# Patient Record
Sex: Male | Born: 1960 | Race: Black or African American | Hispanic: No | Smoking: Never smoker
Health system: Southern US, Community
[De-identification: ages and names within clinical notes are randomized; demographics above are authoritative.]

## PROBLEM LIST (undated history)

## (undated) HISTORY — PX: ANKLE FRACTURE SURGERY: SHX122

---

## 2005-11-20 ENCOUNTER — Encounter (INDEPENDENT_AMBULATORY_CARE_PROVIDER_SITE_OTHER): Payer: Self-pay | Admitting: Internal Medicine

## 2005-11-20 ENCOUNTER — Emergency Department (HOSPITAL_COMMUNITY): Admission: EM | Admit: 2005-11-20 | Discharge: 2005-11-20 | Payer: Self-pay | Admitting: Emergency Medicine

## 2005-11-20 LAB — CONVERTED CEMR LAB: Blood Glucose, Fasting: 106 mg/dL

## 2006-01-06 ENCOUNTER — Ambulatory Visit: Payer: Self-pay | Admitting: Internal Medicine

## 2006-01-08 ENCOUNTER — Encounter (INDEPENDENT_AMBULATORY_CARE_PROVIDER_SITE_OTHER): Payer: Self-pay | Admitting: Internal Medicine

## 2006-01-14 ENCOUNTER — Encounter: Payer: Self-pay | Admitting: Internal Medicine

## 2006-01-14 DIAGNOSIS — M129 Arthropathy, unspecified: Secondary | ICD-10-CM | POA: Insufficient documentation

## 2011-04-13 ENCOUNTER — Emergency Department (HOSPITAL_COMMUNITY)
Admission: EM | Admit: 2011-04-13 | Discharge: 2011-04-13 | Disposition: A | Discharge status: Home / Self Care | Payer: Self-pay | Attending: Emergency Medicine | Admitting: Emergency Medicine

## 2011-04-13 ENCOUNTER — Emergency Department (HOSPITAL_COMMUNITY): Payer: Self-pay

## 2011-04-13 ENCOUNTER — Other Ambulatory Visit: Payer: Self-pay

## 2011-04-13 ENCOUNTER — Encounter (HOSPITAL_COMMUNITY): Payer: Self-pay | Admitting: *Deleted

## 2011-04-13 DIAGNOSIS — M792 Neuralgia and neuritis, unspecified: Secondary | ICD-10-CM

## 2011-04-13 DIAGNOSIS — Z136 Encounter for screening for cardiovascular disorders: Secondary | ICD-10-CM | POA: Insufficient documentation

## 2011-04-13 DIAGNOSIS — IMO0002 Reserved for concepts with insufficient information to code with codable children: Secondary | ICD-10-CM | POA: Insufficient documentation

## 2011-04-13 DIAGNOSIS — R209 Unspecified disturbances of skin sensation: Secondary | ICD-10-CM | POA: Insufficient documentation

## 2011-04-13 MED ORDER — IBUPROFEN 800 MG PO TABS: 800.0000 mg | ORAL_TABLET | Freq: Three times a day (TID) | ORAL | Status: AC

## 2011-04-13 MED ORDER — IBUPROFEN 800 MG PO TABS
800.0000 mg | ORAL_TABLET | Freq: Once | ORAL | Status: AC
  Administered 2011-04-13: 800 mg via ORAL
  Filled 2011-04-13 (×2): qty 1

## 2011-04-13 MED ORDER — ASPIRIN 325 MG PO TABS
325.0000 mg | ORAL_TABLET | Freq: Once | ORAL | Status: AC
  Administered 2011-04-13: 325 mg via ORAL
  Filled 2011-04-13: qty 1

## 2011-04-13 NOTE — ED Notes (Signed)
Pt c/o numbness and tingling in his left arm since 8 am. Pt also c/o shortness of breath. Symptoms started suddenly. Pt has bilateral strong hand grips and leg movements. Pt has symmetry with smiling, squinting his eyes and sticking out his tongue. No difficulty speaking or slurred speech.

## 2011-04-13 NOTE — ED Provider Notes (Signed)
This chart was scribed for [employer]. Colon Branch, MD by Wallis Mart. The patient was seen in room APA08/APA08 and the patient's care was started at 10:00 AM.   CSN: [id number]  Arrival date & time 04/13/11  8469   First MD Initiated Contact with Patient 04/13/11 [phone]      Chief Complaint  Patient presents with  . Numbness    (Consider location/radiation/quality/duration/timing/severity/associated sxs/prior treatment) HPI  Louis [name] is a 51 y.o. male who presents to the Emergency Department complaining of sudden onset, persistence of constant numbness and tingling in left arm onset 8 am. The problem has not worsened or improved since onset. Pt Describes the numbness as a "cold feeling" Pt also c/o SOB that started when waking up.  The SOB did not get worse when walking.  Denies trouble speaking, swallowing, vision changes.  There are no other associated symptoms and no other alleviating or aggravating factors.    [facility] History reviewed. No pertinent past medical history.  Past Surgical History  Procedure Date  . Ankle fracture surgery     left    History reviewed. No pertinent family history.  History  Substance Use Topics  . Smoking status: Never Smoker   . Smokeless tobacco: Not on file  . Alcohol Use: Yes      Review of Systems  10 Systems reviewed and are negative for acute change except as noted in the HPI.  Allergies  Review of patient's allergies indicates no known allergies.  Home Medications  No current outpatient prescriptions on file.  BP 153/109  Pulse 99  Temp(Src) 98.4 F (36.9 C) (Oral)  Resp 18  Ht 5\' 11"  (1.803 m)  Wt 230 lb (104.327 kg)  BMI 32.08 kg/m2  SpO2 97%  Physical Exam  Nursing note and vitals reviewed. Constitutional: He is oriented to person, place, and time. He appears well-developed and well-nourished. No distress.  HENT:  Head: Normocephalic and atraumatic.  Eyes: EOM are normal. Pupils are  equal, round, and reactive to light.  Neck: Neck supple. No tracheal deviation present.       No carotid bruits  Cardiovascular: Normal rate, regular rhythm and normal heart sounds.   Pulmonary/Chest: Effort normal and breath sounds normal. No respiratory distress.  Abdominal: Soft. He exhibits no distension.  Musculoskeletal: Normal range of motion. He exhibits no edema.  Neurological: He is alert and oriented to person, place, and time. No sensory deficit.       Pt is predominately right handed, small amount of difference in strength right to left hand but sensation to light touch in both is normal, no facial asymmetry, no change in speech   Skin: Skin is warm and dry.  Psychiatric: He has a normal mood and affect. His behavior is normal.    ED Course  Procedures (including critical care time)  Date: 04/13/2011  0945  Rate:97  Rhythm: normal sinus rhythm  QRS Axis: normal  Intervals: normal  ST/T Wave abnormalities: normal  Conduction Disutrbances: none  Narrative Interpretation:   Old EKG Reviewed:No significant changes noted  DIAGNOSTIC STUDIES: Oxygen Saturation is 97% on room air, normal by my interpretation.    COORDINATION OF CARE:  1:14 PM: EDP at pt bedside.  All results reviewed and discussed with pt, questions answered, pt agreeable with plan.   Labs Reviewed - No data to display Dg Cervical Spine Complete  04/13/2011  *RADIOLOGY REPORT*  Clinical Data: Left hand and arm numbness.  CERVICAL SPINE - COMPLETE 4+  VIEW  Comparison: None.  Findings: No acute fracture or dislocation.  Degenerative changes with anterior osteophyte formation noted at C5-6. While in part exaggerated by positioning, there appeares to be degenerative changes at the same level that result in left neural foraminal narrowing on the oblique view.  Maintained craniocervical relationship. No prevertebral soft tissue swelling.  No aggressive osseous lesion.  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  No acute osseous abnormality identified.  Original Report Authenticated By: Waneta Martins, M.D.     No diagnosis found.    MDM  Patient with numbness and sensation of cold in his left arm and hand this morning upon awakening. Non focal neuro exam with good sensation and motion. Xray with C5-6 finding c/w associated L neural foraminal narrowing which could account for his symptoms. Initiated antiinflammatory therapy.Pt stable in ED with no significant deterioration in condition.The patient appears reasonably screened and/or stabilized for discharge and I doubt any other medical condition or other [facility] requiring further screening, evaluation, or treatment in the ED at this time prior to discharge.  I personally performed the services described in this documentation, which was scribed in my presence. The recorded information has been reviewed and considered.  MDM Reviewed: nursing note and vitals Interpretation: x-ray          Nicoletta Dress. Colon Branch, MD 04/13/11 1350

## 2011-04-13 NOTE — Discharge Instructions (Signed)
The numbness and tingling you felt is most likely due to a narrowing of the nerve opening in your neck. Use the ibuprofen as directed. It should decrease the swelling and improve the feeling. Follow up with your doctor.

## 2018-05-06 DIAGNOSIS — M546 Pain in thoracic spine: Secondary | ICD-10-CM | POA: Diagnosis not present

## 2018-05-06 DIAGNOSIS — M545 Low back pain: Secondary | ICD-10-CM | POA: Diagnosis not present

## 2018-05-06 DIAGNOSIS — M9903 Segmental and somatic dysfunction of lumbar region: Secondary | ICD-10-CM | POA: Diagnosis not present

## 2018-05-06 DIAGNOSIS — M9902 Segmental and somatic dysfunction of thoracic region: Secondary | ICD-10-CM | POA: Diagnosis not present

## 2019-02-23 DIAGNOSIS — M19072 Primary osteoarthritis, left ankle and foot: Secondary | ICD-10-CM | POA: Diagnosis not present

## 2019-02-23 DIAGNOSIS — M19172 Post-traumatic osteoarthritis, left ankle and foot: Secondary | ICD-10-CM | POA: Diagnosis not present

## 2019-02-23 DIAGNOSIS — Z0000 Encounter for general adult medical examination without abnormal findings: Secondary | ICD-10-CM | POA: Diagnosis not present

## 2019-02-23 DIAGNOSIS — Z13220 Encounter for screening for lipoid disorders: Secondary | ICD-10-CM | POA: Diagnosis not present

## 2019-10-26 ENCOUNTER — Ambulatory Visit (INDEPENDENT_AMBULATORY_CARE_PROVIDER_SITE_OTHER): Payer: BC Managed Care – PPO | Admitting: Orthopaedic Surgery

## 2019-10-26 ENCOUNTER — Ambulatory Visit: Payer: BC Managed Care – PPO

## 2019-10-26 ENCOUNTER — Encounter: Payer: Self-pay | Admitting: Orthopaedic Surgery

## 2019-10-26 ENCOUNTER — Other Ambulatory Visit: Payer: Self-pay

## 2019-10-26 VITALS — BP 151/97 | HR 80 | Ht 70.5 in | Wt 245.0 lb

## 2019-10-26 DIAGNOSIS — M25561 Pain in right knee: Secondary | ICD-10-CM

## 2019-10-26 DIAGNOSIS — G8929 Other chronic pain: Secondary | ICD-10-CM

## 2019-10-26 MED ORDER — NAPROXEN 500 MG PO TABS: 500.0000 mg | ORAL_TABLET | Freq: Two times a day (BID) | ORAL | 5 refills | Status: DC

## 2019-10-26 NOTE — Patient Instructions (Signed)
Journal for Nurse Practitioners, 15(4), 263-267. Retrieved December 01, 2017 from http://clinicalkey.com/nursing">  Knee Exercises Ask your health care provider which exercises are safe for you. Do exercises exactly as told by your health care provider and adjust them as directed. It is normal to feel mild stretching, pulling, tightness, or discomfort as you do these exercises. Stop right away if you feel sudden pain or your pain gets worse. Do not begin these exercises until told by your health care provider. Stretching and range-of-motion exercises These exercises warm up your muscles and joints and improve the movement and flexibility of your knee. These exercises also help to relieve pain and swelling. Knee extension, prone 1. Lie on your abdomen (prone position) on a bed. 2. Place your left / right knee just beyond the edge of the surface so your knee is not on the bed. You can put a towel under your left / right thigh just above your kneecap for comfort. 3. Relax your leg muscles and allow gravity to straighten your knee (extension). You should feel a stretch behind your left / right knee. 4. Hold this position for __________ seconds. 5. Scoot up so your knee is supported between repetitions. Repeat __________ times. Complete this exercise __________ times a day. Knee flexion, active  1. Lie on your back with both legs straight. If this causes back discomfort, bend your left / right knee so your foot is flat on the floor. 2. Slowly slide your left / right heel back toward your buttocks. Stop when you feel a gentle stretch in the front of your knee or thigh (flexion). 3. Hold this position for __________ seconds. 4. Slowly slide your left / right heel back to the starting position. Repeat __________ times. Complete this exercise __________ times a day. Quadriceps stretch, prone  1. Lie on your abdomen on a firm surface, such as a bed or padded floor. 2. Bend your left / right knee and hold  your ankle. If you cannot reach your ankle or pant leg, loop a belt around your foot and grab the belt instead. 3. Gently pull your heel toward your buttocks. Your knee should not slide out to the side. You should feel a stretch in the front of your thigh and knee (quadriceps). 4. Hold this position for __________ seconds. Repeat __________ times. Complete this exercise __________ times a day. Hamstring, supine 1. Lie on your back (supine position). 2. Loop a belt or towel over the ball of your left / right foot. The ball of your foot is on the walking surface, right under your toes. 3. Straighten your left / right knee and slowly pull on the belt to raise your leg until you feel a gentle stretch behind your knee (hamstring). ? Do not let your knee bend while you do this. ? Keep your other leg flat on the floor. 4. Hold this position for __________ seconds. Repeat __________ times. Complete this exercise __________ times a day. Strengthening exercises These exercises build strength and endurance in your knee. Endurance is the ability to use your muscles for a long time, even after they get tired. Quadriceps, isometric This exercise stretches the muscles in front of your thigh (quadriceps) without moving your knee joint (isometric). 1. Lie on your back with your left / right leg extended and your other knee bent. Put a rolled towel or small pillow under your knee if told by your health care provider. 2. Slowly tense the muscles in the front of your left /   right thigh. You should see your kneecap slide up toward your hip or see increased dimpling just above the knee. This motion will push the back of the knee toward the floor. 3. For __________ seconds, hold the muscle as tight as you can without increasing your pain. 4. Relax the muscles slowly and completely. Repeat __________ times. Complete this exercise __________ times a day. Straight leg raises This exercise stretches the muscles in front  of your thigh (quadriceps) and the muscles that move your hips (hip flexors). 1. Lie on your back with your left / right leg extended and your other knee bent. 2. Tense the muscles in the front of your left / right thigh. You should see your kneecap slide up or see increased dimpling just above the knee. Your thigh may even shake a bit. 3. Keep these muscles tight as you raise your leg 4-6 inches (10-15 cm) off the floor. Do not let your knee bend. 4. Hold this position for __________ seconds. 5. Keep these muscles tense as you lower your leg. 6. Relax your muscles slowly and completely after each repetition. Repeat __________ times. Complete this exercise __________ times a day. Hamstring, isometric 1. Lie on your back on a firm surface. 2. Bend your left / right knee about __________ degrees. 3. Dig your left / right heel into the surface as if you are trying to pull it toward your buttocks. Tighten the muscles in the back of your thighs (hamstring) to "dig" as hard as you can without increasing any pain. 4. Hold this position for __________ seconds. 5. Release the tension gradually and allow your muscles to relax completely for __________ seconds after each repetition. Repeat __________ times. Complete this exercise __________ times a day. Hamstring curls If told by your health care provider, do this exercise while wearing ankle weights. Begin with __________ lb weights. Then increase the weight by 1 lb (0.5 kg) increments. Do not wear ankle weights that are more than __________ lb. 1. Lie on your abdomen with your legs straight. 2. Bend your left / right knee as far as you can without feeling pain. Keep your hips flat against the floor. 3. Hold this position for __________ seconds. 4. Slowly lower your leg to the starting position. Repeat __________ times. Complete this exercise __________ times a day. Squats This exercise strengthens the muscles in front of your thigh and knee  (quadriceps). 1. Stand in front of a table, with your feet and knees pointing straight ahead. You may rest your hands on the table for balance but not for support. 2. Slowly bend your knees and lower your hips like you are going to sit in a chair. ? Keep your weight over your heels, not over your toes. ? Keep your lower legs upright so they are parallel with the table legs. ? Do not let your hips go lower than your knees. ? Do not bend lower than told by your health care provider. ? If your knee pain increases, do not bend as low. 3. Hold the squat position for __________ seconds. 4. Slowly push with your legs to return to standing. Do not use your hands to pull yourself to standing. Repeat __________ times. Complete this exercise __________ times a day. Wall slides This exercise strengthens the muscles in front of your thigh and knee (quadriceps). 1. Lean your back against a smooth wall or door, and walk your feet out 18-24 inches (46-61 cm) from it. 2. Place your feet hip-width apart. 3.   Slowly slide down the wall or door until your knees bend __________ degrees. Keep your knees over your heels, not over your toes. Keep your knees in line with your hips. 4. Hold this position for __________ seconds. Repeat __________ times. Complete this exercise __________ times a day. Straight leg raises This exercise strengthens the muscles that rotate the leg at the hip and move it away from your body (hip abductors). 1. Lie on your side with your left / right leg in the top position. Lie so your head, shoulder, knee, and hip line up. You may bend your bottom knee to help you keep your balance. 2. Roll your hips slightly forward so your hips are stacked directly over each other and your left / right knee is facing forward. 3. Leading with your heel, lift your top leg 4-6 inches (10-15 cm). You should feel the muscles in your outer hip lifting. ? Do not let your foot drift forward. ? Do not let your knee  roll toward the ceiling. 4. Hold this position for __________ seconds. 5. Slowly return your leg to the starting position. 6. Let your muscles relax completely after each repetition. Repeat __________ times. Complete this exercise __________ times a day. Straight leg raises This exercise stretches the muscles that move your hips away from the front of the pelvis (hip extensors). 1. Lie on your abdomen on a firm surface. You can put a pillow under your hips if that is more comfortable. 2. Tense the muscles in your buttocks and lift your left / right leg about 4-6 inches (10-15 cm). Keep your knee straight as you lift your leg. 3. Hold this position for __________ seconds. 4. Slowly lower your leg to the starting position. 5. Let your leg relax completely after each repetition. Repeat __________ times. Complete this exercise __________ times a day. This information is not intended to replace advice given to you by your health care provider. Make sure you discuss any questions you have with your health care provider. Document Revised: 12/02/2017 Document Reviewed: 12/02/2017 Elsevier Patient Education  2020 [employer]  

## 2019-10-26 NOTE — Progress Notes (Signed)
Subjective:    Patient ID: Louis [name], male    DOB: 04-13-1960, 59 y.o.   MRN: [id number]  HPI He has had increasing knee pain over the last two to three months.  He has swelling and popping of the right knee.  He has no trauma.  He has no giving way.  He has no redness.  He has tried ice, heat, rest and Advil with little help.     Review of Systems  Constitutional: Positive for activity change.  Musculoskeletal: Positive for arthralgias, gait problem and joint swelling.  All other systems reviewed and are negative.  For Review of Systems, all other systems reviewed and are negative.  The following is a summary of the past history medically, past history surgically, known current medicines, social history and family history.  This information is gathered electronically by the computer from prior information and documentation.  I review this each visit and have found including this information at this point in the chart is beneficial and informative.   History reviewed. No pertinent past medical history.  Past Surgical History:  Procedure Laterality Date  . ANKLE FRACTURE SURGERY     left    Current Outpatient Medications on File Prior to Visit  Medication Sig Dispense Refill  . diclofenac (VOLTAREN) 25 MG EC tablet Take 25-50 mg by mouth as needed.     Marland Kitchen ibuprofen (ADVIL,MOTRIN) 200 MG tablet Take 400 mg by mouth every 6 (six) hours as needed. For pain     No current facility-administered medications on file prior to visit.    Social History   Socioeconomic History  . Marital status: Divorced    Spouse name: Not on file  . Number of children: Not on file  . Years of education: Not on file  . Highest education level: Not on file  Occupational History  . Not on file  Tobacco Use  . Smoking status: Never Smoker  . Smokeless tobacco: Never Used  Substance and Sexual Activity  . Alcohol use: Yes  . Drug use: No  . Sexual activity: Not Currently  Other Topics  Concern  . Not on file  Social History Narrative  . Not on file   Social Determinants of Health   Financial Resource Strain:   . Difficulty of Paying Living Expenses: Not on file  Food Insecurity:   . Worried About Charity fundraiser in the Last Year: Not on file  . Ran Out of Food in the Last Year: Not on file  Transportation Needs:   . Lack of Transportation (Medical): Not on file  . Lack of Transportation (Non-Medical): Not on file  Physical Activity:   . Days of Exercise per Week: Not on file  . Minutes of Exercise per Session: Not on file  Stress:   . Feeling of Stress : Not on file  Social Connections:   . Frequency of Communication with Friends and Family: Not on file  . Frequency of Social Gatherings with Friends and Family: Not on file  . Attends Religious Services: Not on file  . Active Member of Clubs or Organizations: Not on file  . Attends Archivist Meetings: Not on file  . Marital Status: Not on file  Intimate Partner Violence:   . Fear of Current or Ex-Partner: Not on file  . Emotionally Abused: Not on file  . Physically Abused: Not on file  . Sexually Abused: Not on file    Family History  Problem Relation Age  of Onset  . Broken bones Neg Hx   . Diabetes Neg Hx   . Osteoporosis Neg Hx   . Dislocations Neg Hx     BP (!) 151/97   Pulse 80   Ht 5' 10.5" (1.791 m)   Wt 245 lb (111.1 kg)   BMI 34.66 kg/m   Body mass index is 34.66 kg/m.     Objective:   Physical Exam Vitals and nursing note reviewed.  Constitutional:      Appearance: He is well-developed.  HENT:     Head: Normocephalic and atraumatic.  Eyes:     Conjunctiva/sclera: Conjunctivae normal.     Pupils: Pupils are equal, round, and reactive to light.  Cardiovascular:     Rate and Rhythm: Normal rate and regular rhythm.  Pulmonary:     Effort: Pulmonary effort is normal.  Abdominal:     Palpations: Abdomen is soft.  Musculoskeletal:     Cervical back: Normal range  of motion and neck supple.       Legs:  Skin:    General: Skin is warm and dry.  Neurological:     Mental Status: He is alert and oriented to person, place, and time.     Cranial Nerves: No cranial nerve deficit.     Motor: No abnormal muscle tone.     Coordination: Coordination normal.     Deep Tendon Reflexes: Reflexes are normal and symmetric. Reflexes normal.  Psychiatric:        Behavior: Behavior normal.        Thought Content: Thought content normal.        Judgment: Judgment normal.    X-rays were done of the right knee, reported separately.      Assessment & Plan:   Encounter Diagnosis  Name Primary?  . Chronic pain of right knee Yes   PROCEDURE NOTE:  The patient requests injections of the right knee , verbal consent was obtained.  The right knee was prepped appropriately after time out was performed.   Sterile technique was observed and injection of 1 cc of Depo-Medrol 40 mg with several cc's of plain xylocaine. Anesthesia was provided by ethyl chloride and a 20-gauge needle was used to inject the knee area. The injection was tolerated well.  A band aid dressing was applied.  The patient was advised to apply ice later today and tomorrow to the injection sight as needed.  Begin Naprosyn 500 po bid pc.  Return in 3 weeks.  He may need MRI.  Call if any problem.  Precautions discussed.   Electronically Signed Sanjuana Kava, MD 8/31/202111:33 AM

## 2019-11-16 ENCOUNTER — Encounter: Payer: Self-pay | Admitting: Orthopaedic Surgery

## 2019-11-16 ENCOUNTER — Other Ambulatory Visit: Payer: Self-pay

## 2019-11-16 ENCOUNTER — Ambulatory Visit (INDEPENDENT_AMBULATORY_CARE_PROVIDER_SITE_OTHER): Payer: BC Managed Care – PPO | Admitting: Orthopaedic Surgery

## 2019-11-16 VITALS — BP 141/98 | HR 87 | Ht 70.5 in | Wt 246.0 lb

## 2019-11-16 DIAGNOSIS — G8929 Other chronic pain: Secondary | ICD-10-CM | POA: Diagnosis not present

## 2019-11-16 DIAGNOSIS — M25561 Pain in right knee: Secondary | ICD-10-CM

## 2019-11-16 NOTE — Progress Notes (Signed)
Patient Louis [name], male DOB:16-Nov-1960, 59 y.o. ZSW:[id number]  Chief Complaint  Patient presents with  . Knee Pain    right     HPI  Louis [name] is a 59 y.o. male who has right knee pain.  He is improved after the injection last time.  He still has some pain and swelling but not like before.   He is taking the Naprosyn.  He has no new trauma.  He has no giving way.   Body mass index is 34.8 kg/m.  ROS  Review of Systems  Constitutional: Positive for activity change.  Musculoskeletal: Positive for arthralgias, gait problem and joint swelling.  All other systems reviewed and are negative.   All other systems reviewed and are negative.  The following is a summary of the past history medically, past history surgically, known current medicines, social history and family history.  This information is gathered electronically by the computer from prior information and documentation.  I review this each visit and have found including this information at this point in the chart is beneficial and informative.    History reviewed. No pertinent past medical history.  Past Surgical History:  Procedure Laterality Date  . ANKLE FRACTURE SURGERY     left    Family History  Problem Relation Age of Onset  . Broken bones Neg Hx   . Diabetes Neg Hx   . Osteoporosis Neg Hx   . Dislocations Neg Hx     Social History Social History   Tobacco Use  . Smoking status: Never Smoker  . Smokeless tobacco: Never Used  Substance Use Topics  . Alcohol use: Yes  . Drug use: No    No Known Allergies  Current Outpatient Medications  Medication Sig Dispense Refill  . naproxen (NAPROSYN) 500 MG tablet Take 1 tablet (500 mg total) by mouth 2 (two) times daily with a meal. 60 tablet 5   No current facility-administered medications for this visit.     Physical Exam  Blood pressure (!) 141/98, pulse 87, height 5' 10.5" (1.791 m), weight 246 lb (111.6 kg).  Constitutional:  overall normal hygiene, normal nutrition, well developed, normal grooming, normal body habitus. Assistive device:none  Musculoskeletal: gait and station Limp right slightlyl, muscle tone and strength are normal, no tremors or atrophy is present.  .  Neurological: coordination overall normal.  Deep tendon reflex/nerve stretch intact.  Sensation normal.  Cranial nerves II-XII intact.   Skin:   Normal overall no scars, lesions, ulcers or rashes. No psoriasis.  Psychiatric: Alert and oriented x 3.  Recent memory intact, remote memory unclear.  Normal mood and affect. Well groomed.  Good eye contact.  Cardiovascular: overall no swelling, no varicosities, no edema bilaterally, normal temperatures of the legs and arms, no clubbing, cyanosis and good capillary refill.  Lymphatic: palpation is normal.  Right knee with ROM 0 to 115, stable, crepitus, no effusion, slight limp right, NV intact.  All other systems reviewed and are negative   The patient has been educated about the nature of the problem(s) and counseled on treatment options.  The patient appeared to understand what I have discussed and is in agreement with it.  Encounter Diagnosis  Name Primary?  . Chronic pain of right knee Yes    PLAN Call if any problems.  Precautions discussed.  Continue current medications.   Return to clinic 1 month   Electronically Signed Sanjuana Kava, MD 9/21/20219:19 AM

## 2019-11-16 NOTE — Patient Instructions (Signed)
Use Aspercreme, Biofreeze or Voltaren gel over the counter 2-3 times daily make sure you rub it in well each time you use it.    Call if it gets worse we will see you sooner

## 2019-11-23 ENCOUNTER — Ambulatory Visit: Payer: BC Managed Care – PPO | Admitting: Orthopaedic Surgery

## 2019-12-14 ENCOUNTER — Other Ambulatory Visit: Payer: Self-pay

## 2019-12-14 ENCOUNTER — Encounter: Payer: Self-pay | Admitting: Orthopaedic Surgery

## 2019-12-14 ENCOUNTER — Ambulatory Visit: Payer: BC Managed Care – PPO | Admitting: Orthopaedic Surgery

## 2019-12-14 VITALS — BP 131/85 | HR 85 | Ht 70.0 in | Wt 245.0 lb

## 2019-12-14 DIAGNOSIS — M25561 Pain in right knee: Secondary | ICD-10-CM

## 2019-12-14 DIAGNOSIS — G8929 Other chronic pain: Secondary | ICD-10-CM

## 2019-12-14 NOTE — Progress Notes (Signed)
Patient Louis [name], male DOB:Aug 30, 1960, 59 y.o. WUJ:[id number]  Chief Complaint  Patient presents with  . Knee Pain    Chronic right knee pain.    HPI  Louis [name] is a 59 y.o. male who has continued pain of the right knee.  He has swelling and popping.  He has some days with little pain and other days when it hurts.  He feels it may give way but it has not.    I will set up PT and then MRI of the right knee.  I am concerned about meniscus tear.   Body mass index is 35.15 kg/m.  ROS  Review of Systems  Constitutional: Positive for activity change.  Musculoskeletal: Positive for arthralgias, gait problem and joint swelling.  All other systems reviewed and are negative.   All other systems reviewed and are negative.  The following is a summary of the past history medically, past history surgically, known current medicines, social history and family history.  This information is gathered electronically by the computer from prior information and documentation.  I review this each visit and have found including this information at this point in the chart is beneficial and informative.    History reviewed. No pertinent past medical history.  Past Surgical History:  Procedure Laterality Date  . ANKLE FRACTURE SURGERY     left    Family History  Problem Relation Age of Onset  . Broken bones Neg Hx   . Diabetes Neg Hx   . Osteoporosis Neg Hx   . Dislocations Neg Hx     Social History Social History   Tobacco Use  . Smoking status: Never Smoker  . Smokeless tobacco: Never Used  Substance Use Topics  . Alcohol use: Yes  . Drug use: No    No Known Allergies  Current Outpatient Medications  Medication Sig Dispense Refill  . naproxen (NAPROSYN) 500 MG tablet Take 1 tablet (500 mg total) by mouth 2 (two) times daily with a meal. 60 tablet 5   No current facility-administered medications for this visit.     Physical Exam  Blood pressure 131/85, pulse  85, height 5\' 10"  (1.778 m), weight 245 lb (111.1 kg).  Constitutional: overall normal hygiene, normal nutrition, well developed, normal grooming, normal body habitus. Assistive device:none  Musculoskeletal: gait and station Limp right, muscle tone and strength are normal, no tremors or atrophy is present.  .  Neurological: coordination overall normal.  Deep tendon reflex/nerve stretch intact.  Sensation normal.  Cranial nerves II-XII intact.   Skin:   Normal overall no scars, lesions, ulcers or rashes. No psoriasis.  Psychiatric: Alert and oriented x 3.  Recent memory intact, remote memory unclear.  Normal mood and affect. Well groomed.  Good eye contact.  Cardiovascular: overall no swelling, no varicosities, no edema bilaterally, normal temperatures of the legs and arms, no clubbing, cyanosis and good capillary refill.  Lymphatic: palpation is normal.  Right knee is tender, has effusion, crepitus, positive medial McMurray, limp right, NV intact.  All other systems reviewed and are negative   The patient has been educated about the nature of the problem(s) and counseled on treatment options.  The patient appeared to understand what I have discussed and is in agreement with it.  Encounter Diagnosis  Name Primary?  . Chronic pain of right knee Yes    PLAN Call if any problems.  Precautions discussed.  Continue current medications.   Return to clinic 3 weeks   To go to  PT.  To get MRI of the right knee.  Electronically Signed Sanjuana Kava, MD 10/19/20218:52 AM

## 2019-12-29 ENCOUNTER — Other Ambulatory Visit: Payer: Self-pay

## 2019-12-29 ENCOUNTER — Encounter (HOSPITAL_COMMUNITY): Payer: Self-pay | Admitting: Physical Therapy

## 2019-12-29 ENCOUNTER — Ambulatory Visit (HOSPITAL_COMMUNITY): Payer: BC Managed Care – PPO | Attending: Orthopaedic Surgery | Admitting: Physical Therapy

## 2019-12-29 DIAGNOSIS — M6281 Muscle weakness (generalized): Secondary | ICD-10-CM | POA: Insufficient documentation

## 2019-12-29 DIAGNOSIS — M25561 Pain in right knee: Secondary | ICD-10-CM | POA: Diagnosis not present

## 2019-12-29 DIAGNOSIS — R29898 Other symptoms and signs involving the musculoskeletal system: Secondary | ICD-10-CM | POA: Diagnosis not present

## 2019-12-29 DIAGNOSIS — R2689 Other abnormalities of gait and mobility: Secondary | ICD-10-CM | POA: Diagnosis not present

## 2019-12-29 NOTE — Patient Instructions (Signed)
Access Code: [email or web address] URL: [email or web address] Date: 12/29/2019 Prepared by: Mitzi Hansen Taiquan Campanaro  Exercises Sit to Stand with Arms Crossed - 1 x daily - 7 x weekly - 2 sets - 10 reps Seated Long Arc Quad - 1 x daily - 7 x weekly - 10 reps - 10 second hold

## 2019-12-29 NOTE — Therapy (Signed)
[place], [place], [address] Phone: [phone]   Fax:  [phone]  Physical Therapy Evaluation  Patient Details  Name: Louis [name] MRN: [id number] Date of Birth: 1960-11-26 Referring Provider (PT): Sanjuana Kava MD   Encounter Date: 12/29/2019   PT End of Session - 12/29/19 1124    Visit Number 1    Number of Visits 8    Date for PT Re-Evaluation 01/26/20    Authorization Type BCBS (no auth, VL 60 PT/OT)    PT Start Time 1045    PT Stop Time 1121    PT Time Calculation (min) 36 min    Activity Tolerance Patient tolerated treatment well    Behavior During Therapy [facility] for tasks assessed/performed           History reviewed. No pertinent past medical history.  Past Surgical History:  Procedure Laterality Date   ANKLE FRACTURE SURGERY     left    There were no vitals filed for this visit.    Subjective Assessment - 12/29/19 1044    Subjective Patient is a 59 y.o. male who presents to physical therapy with c/o chronic R knee pain. His knee has been bother him for a few months with insidious onset. Symptoms are worse with stairs, transfers, and sitting for extended periods. His pain is not constant and is worst with going down stairs. He denies popping, clicking, catching. He has been taking Naproxen which helped some. He denies history of knee problems. His main goal is to avoid surgery.    Limitations Other (comment)   stairs   Patient Stated Goals avoid surgery    Currently in Pain? No/denies   worst 7/10 with going down stairs             [facility] PT Assessment - 12/29/19 0001      Assessment   Medical Diagnosis Chronic Pain of R Knee    Referring Provider (PT) Sanjuana Kava MD    Onset Date/Surgical Date 08/28/19    Next MD Visit Nov 19    Prior Therapy None      Precautions   Precautions None      Restrictions   Weight Bearing Restrictions No      Balance Screen   Has the patient  fallen in the past 6 months No    Has the patient had a decrease in activity level because of a fear of falling?  No    Is the patient reluctant to leave their home because of a fear of falling?  No      Prior Function   Level of Independence Independent    Vocation Full time employment    Vocation Requirements [employer]      Cognition   Overall Cognitive Status Within Functional Limits for tasks assessed      Observation/Other Assessments   Observations Ambulates without AD    Focus on Therapeutic Outcomes (FOTO)  13% limited      ROM / Strength   AROM / PROM / Strength AROM;Strength      AROM   AROM Assessment Site Knee    Right/Left Knee Right    Right Knee Extension 0    Right Knee Flexion 128      Strength   Strength Assessment Site Hip;Knee;Ankle    Right/Left Hip Right;Left    Right Hip Flexion 4+/5    Left Hip Flexion 4+/5    Right/Left Knee Right;Left  Right Knee Flexion 5/5    Right Knee Extension 5/5    Left Knee Flexion 5/5    Left Knee Extension 5/5    Right/Left Ankle Right;Left    Right Ankle Dorsiflexion 5/5    Left Ankle Dorsiflexion 5/5      Palpation   Patella mobility WNL    Palpation comment TTP R tibial tuberosity, not tender throughout bilateral LE       Special Tests   Other special tests Forward step down test: R impaired quad and hip strenght/motor control requiring UE support;  L difficulty due to L ankle stiffness      Transfers   Five time sit to stand comments  11.29 seconds    Comments without UE use, knee pain during       Ambulation/Gait   Ambulation/Gait Yes    Ambulation/Gait Assistance 7: Independent    Ambulation Distance (Feet) 100 Feet    Assistive device None    Gait Pattern Within Functional Limits;Step-through pattern    Stairs Yes    Stairs Assistance 7: Independent    Stair Management Technique No rails    Number of Stairs 8    Height of Stairs 7    Gait Comments decreased eccentric control with stairs                        Objective measurements completed on examination: See above findings.       [facility] Adult PT Treatment/Exercise - 12/29/19 0001      Exercises   Exercises Knee/Hip      Knee/Hip Exercises: Seated   Long Arc Quad 10 reps    Long Arc Quad Limitations 10 second holds    Sit to [employer] 2 sets;10 reps                  PT Education - 12/29/19 1043    Education Details Patient educated on exam findings, POC, scope of PT, initial HEP    Person(s) Educated Patient    Methods Explanation;Demonstration;Handout    Comprehension Verbalized understanding;Returned demonstration            PT Short Term Goals - 12/29/19 1131      PT SHORT TERM GOAL #1   Title Patient will be independent with HEP in order to improve functional outcomes.    Time 2    Period Weeks    Status New    Target Date 01/12/20      PT SHORT TERM GOAL #2   Title Patient will report at least 25% improvement in symptoms for improved quality of life.    Time 2    Period Weeks    Status New    Target Date 01/12/20             PT Long Term Goals - 12/29/19 1226      PT LONG TERM GOAL #1   Title Patient will report at least 75% improvement in symptoms for improved quality of life.    Time 4    Period Weeks    Status New    Target Date 01/26/20      PT LONG TERM GOAL #2   Title Patient will improve FOTO score by at least 5 points in order to indicate improved tolerance to activity.    Time 4    Period Weeks    Status New    Target Date 01/26/20      PT LONG TERM  GOAL #3   Title Patient will be able to navigate stairs with reciprocal pattern without compensation on RLE in order to demonstrate improved LE strength and motor control.    Time 4    Period Weeks    Status New    Target Date 01/26/20                  Plan - 12/29/19 1125    Clinical Impression Statement Patient is a 59 y.o. male who presents to physical therapy with c/o chronic R knee pain.  He presents with pain limited deficits in R knee strength, motor control, and functional mobility with ADL. He is having to modify and restrict ADL as indicated by FOTO score as well as subjective information and objective measures which is affecting overall participation. Patient will benefit from skilled physical therapy in order to improve function and reduce impairment.    Personal Factors and Comorbidities Fitness;Profession    Examination-Activity Limitations Transfers;Stairs;Squat;Locomotion Level    Examination-Participation Restrictions Occupation;Yard Work;Community Activity    Stability/Clinical Decision Making Stable/Uncomplicated    Clinical Decision Making Low    Rehab Potential Good    PT Frequency 2x / week    PT Duration 4 weeks    PT Treatment/Interventions ADLs/Self Care Home Management;Cryotherapy;Electrical Stimulation;DME Instruction;Ultrasound;Iontophoresis 4mg /ml Dexamethasone;Moist Heat;Traction;Gait training;Stair training;Functional mobility training;Therapeutic activities;Therapeutic exercise;Balance training;Neuromuscular re-education;Patient/family education;Orthotic Fit/Training;Manual techniques;Passive range of motion;Dry needling;Energy conservation;Splinting;Taping;Joint Manipulations    PT Next Visit Plan continue LE strengtheing with emphasis on quads, possibly further assess hip strength, begin functional strengthing with squats, steps, lunges, etc.    PT Home Exercise Plan 11/3 sit to stand, LAQ    Consulted and Agree with Plan of Care Patient           Patient will benefit from skilled therapeutic intervention in order to improve the following deficits and impairments:  Difficulty walking, Decreased endurance, Pain, Improper body mechanics, Decreased mobility, Decreased strength  Visit Diagnosis: Right knee pain, unspecified chronicity  Muscle weakness (generalized)  Other abnormalities of gait and mobility  Other symptoms and signs involving the  musculoskeletal system     Problem List Patient Active Problem List   Diagnosis Date Noted   ARTHROPATHY NOS, UNSPECIFIED SITE 01/14/2006   12:28 PM, 12/29/19 Mearl Latin PT, DPT Physical Therapist at [place], [place], [address] Phone: [phone]   Fax:  [phone]  Name: Louis [name] MRN: [id number] Date of Birth: 1960/10/02

## 2019-12-31 ENCOUNTER — Telehealth: Payer: Self-pay | Admitting: Orthopaedic Surgery

## 2019-12-31 NOTE — Telephone Encounter (Signed)
-----   Message from Cassandria Santee sent at 12/31/2019  9:25 AM EDT ----- Regarding: mri Do you know anything about this patient having an MRI?

## 2019-12-31 NOTE — Telephone Encounter (Signed)
Patient is going to PT at this time and he is coming back for a follow up 01/04/2020. He is concerned about the MRI. I let him know that we would get the order.

## 2020-01-04 ENCOUNTER — Other Ambulatory Visit: Payer: Self-pay

## 2020-01-04 ENCOUNTER — Encounter (HOSPITAL_COMMUNITY): Payer: Self-pay | Admitting: Physical Therapy

## 2020-01-04 ENCOUNTER — Telehealth: Payer: Self-pay | Admitting: Radiology

## 2020-01-04 ENCOUNTER — Encounter: Payer: Self-pay | Admitting: Orthopaedic Surgery

## 2020-01-04 ENCOUNTER — Ambulatory Visit (HOSPITAL_COMMUNITY): Payer: BC Managed Care – PPO | Admitting: Physical Therapy

## 2020-01-04 ENCOUNTER — Ambulatory Visit: Payer: BC Managed Care – PPO | Admitting: Orthopaedic Surgery

## 2020-01-04 VITALS — BP 131/89 | HR 87 | Ht 70.0 in | Wt 245.0 lb

## 2020-01-04 DIAGNOSIS — G8929 Other chronic pain: Secondary | ICD-10-CM

## 2020-01-04 DIAGNOSIS — M25561 Pain in right knee: Secondary | ICD-10-CM

## 2020-01-04 DIAGNOSIS — R29898 Other symptoms and signs involving the musculoskeletal system: Secondary | ICD-10-CM

## 2020-01-04 DIAGNOSIS — M6281 Muscle weakness (generalized): Secondary | ICD-10-CM

## 2020-01-04 DIAGNOSIS — R2689 Other abnormalities of gait and mobility: Secondary | ICD-10-CM

## 2020-01-04 NOTE — Patient Instructions (Signed)
Access Code: [email or web address] URL: [email or web address] Date: 01/04/2020 Prepared by: Josue Hector  Exercises Supine Quad Set - 2 x daily - 7 x weekly - 2 sets - 10 reps - 5 second hold Active Straight Leg Raise with Quad Set - 2 x daily - 7 x weekly - 2 sets - 10 reps Supine Bridge - 2 x daily - 7 x weekly - 2 sets - 10 reps - 5 second hold Sidelying Hip Abduction - 2 x daily - 7 x weekly - 2 sets - 10 reps Standing Heel Raise with Support - 2 x daily - 7 x weekly - 2 sets - 10 reps

## 2020-01-04 NOTE — Therapy (Signed)
[place], [place], [address] Phone: [phone]   Fax:  [phone]  Physical Therapy Treatment  Patient Details  Name: Louis [name] MRN: [id number] Date of Birth: 03-03-1960 Referring Provider (PT): Sanjuana Kava MD   Encounter Date: 01/04/2020   PT End of Session - 01/04/20 1055    Visit Number 2    Number of Visits 8    Date for PT Re-Evaluation 01/26/20    Authorization Type BCBS (no auth, VL 60 PT/OT)    PT Start Time 1050    PT Stop Time 1115    PT Time Calculation (min) 25 min    Activity Tolerance Patient tolerated treatment well    Behavior During Therapy [facility] for tasks assessed/performed           History reviewed. No pertinent past medical history.  Past Surgical History:  Procedure Laterality Date  . ANKLE FRACTURE SURGERY     left    There were no vitals filed for this visit.   Subjective Assessment - 01/04/20 1055    Subjective Patient had followed up with MD this morning, says he wants to order an MRI. Says he goes back in a month. Says HEP exercises are going alright. No new complaints. No pain currently.    Limitations Other (comment)   stairs   Patient Stated Goals avoid surgery    Currently in Pain? No/denies                             [facility] Adult PT Treatment/Exercise - 01/04/20 0001      Knee/Hip Exercises: Standing   Heel Raises Both;1 set;10 reps      Knee/Hip Exercises: Supine   Quad Sets Right;10 reps    Quad Sets Limitations 5 seconds    Bridges Both;1 set;10 reps    Straight Leg Raises Right;1 set;10 reps      Knee/Hip Exercises: Sidelying   Hip ABduction Right;1 set;10 reps                  PT Education - 01/04/20 1113    Education Details on goals review, HEP, exercise and technique, updated HEP    Person(s) Educated Patient    Methods Explanation;Handout    Comprehension Verbalized understanding            PT Short Term  Goals - 12/29/19 1131      PT SHORT TERM GOAL #1   Title Patient will be independent with HEP in order to improve functional outcomes.    Time 2    Period Weeks    Status New    Target Date 01/12/20      PT SHORT TERM GOAL #2   Title Patient will report at least 25% improvement in symptoms for improved quality of life.    Time 2    Period Weeks    Status New    Target Date 01/12/20             PT Long Term Goals - 12/29/19 1226      PT LONG TERM GOAL #1   Title Patient will report at least 75% improvement in symptoms for improved quality of life.    Time 4    Period Weeks    Status New    Target Date 01/26/20      PT LONG TERM GOAL #2   Title Patient will improve FOTO score  by at least 5 points in order to indicate improved tolerance to activity.    Time 4    Period Weeks    Status New    Target Date 01/26/20      PT LONG TERM GOAL #3   Title Patient will be able to navigate stairs with reciprocal pattern without compensation on RLE in order to demonstrate improved LE strength and motor control.    Time 4    Period Weeks    Status New    Target Date 01/26/20                 Plan - 01/04/20 1116    Clinical Impression Statement Session abbreviated as indicated per patient late arrival. Reviewed therapy goals and HEP from eval. Progressed ther ex for hip and knee strengthening. Patient educated on proper form and function of all added exercise. Patient cued on LE position and hold times with forward and side lying leg raises. Patient educated on and issued updated HEP handout. Patient will continue to benefit from skilled therapy to progress knee strength and mobility to reduce pain and improve functional mobility.    Personal Factors and Comorbidities Fitness;Profession    Examination-Activity Limitations Transfers;Stairs;Squat;Locomotion Level    Examination-Participation Restrictions Occupation;Yard Work;Community Activity    Stability/Clinical Decision  Making Stable/Uncomplicated    Rehab Potential Good    PT Frequency 2x / week    PT Duration 4 weeks    PT Treatment/Interventions ADLs/Self Care Home Management;Cryotherapy;Electrical Stimulation;DME Instruction;Ultrasound;Iontophoresis 4mg /ml Dexamethasone;Moist Heat;Traction;Gait training;Stair training;Functional mobility training;Therapeutic activities;Therapeutic exercise;Balance training;Neuromuscular re-education;Patient/family education;Orthotic Fit/Training;Manual techniques;Passive range of motion;Dry needling;Energy conservation;Splinting;Taping;Joint Manipulations    PT Next Visit Plan continue LE strengtheing with emphasis on quads, functional strengthing with squats, steps, lunges, etc.    PT Home Exercise Plan 11/3 sit to stand, LAQ 01/04/20: bridge, quad set, slr, sideying hip abduction, heel raises    Consulted and Agree with Plan of Care Patient           Patient will benefit from skilled therapeutic intervention in order to improve the following deficits and impairments:  Difficulty walking, Decreased endurance, Pain, Improper body mechanics, Decreased mobility, Decreased strength  Visit Diagnosis: Right knee pain, unspecified chronicity  Muscle weakness (generalized)  Other abnormalities of gait and mobility  Other symptoms and signs involving the musculoskeletal system     Problem List Patient Active Problem List   Diagnosis Date Noted  . ARTHROPATHY NOS, UNSPECIFIED SITE 01/14/2006    11:18 AM, 01/04/20 Josue Hector PT DPT  Physical Therapist with [facility]  [phone] [place] [address] [place], [place], [address] Phone: [phone]   Fax:  [phone]  Name: Louis [name] MRN: [id number] Date of Birth: 16-Dec-1960

## 2020-01-04 NOTE — Progress Notes (Signed)
Patient Louis [name], male DOB:23-Dec-1960, 59 y.o. VWP:[id number]  Chief Complaint  Patient presents with  . Knee Pain    Chronic pain of right knee    HPI  Louis [name] is a 59 y.o. male who has continued pain of the right knee.  He has pain, swelling and giving way.  He is not better. He has been to PT.  I will get MRI. I have reviewed PT notes.  Body mass index is 35.15 kg/m.  ROS  Review of Systems  Constitutional: Positive for activity change.  Musculoskeletal: Positive for arthralgias, gait problem and joint swelling.  All other systems reviewed and are negative.   All other systems reviewed and are negative.  The following is a summary of the past history medically, past history surgically, known current medicines, social history and family history.  This information is gathered electronically by the computer from prior information and documentation.  I review this each visit and have found including this information at this point in the chart is beneficial and informative.    No past medical history on file.  Past Surgical History:  Procedure Laterality Date  . ANKLE FRACTURE SURGERY     left    Family History  Problem Relation Age of Onset  . Broken bones Neg Hx   . Diabetes Neg Hx   . Osteoporosis Neg Hx   . Dislocations Neg Hx     Social History Social History   Tobacco Use  . Smoking status: Never Smoker  . Smokeless tobacco: Never Used  Substance Use Topics  . Alcohol use: Yes  . Drug use: No    No Known Allergies  Current Outpatient Medications  Medication Sig Dispense Refill  . naproxen (NAPROSYN) 500 MG tablet Take 1 tablet (500 mg total) by mouth 2 (two) times daily with a meal. 60 tablet 5   No current facility-administered medications for this visit.     Physical Exam  Blood pressure 131/89, pulse 87, height 5\' 10"  (1.778 m), weight 245 lb (111.1 kg).  Constitutional: overall normal hygiene, normal nutrition, well  developed, normal grooming, normal body habitus. Assistive device:none  Musculoskeletal: gait and station Limp right, muscle tone and strength are normal, no tremors or atrophy is present.  .  Neurological: coordination overall normal.  Deep tendon reflex/nerve stretch intact.  Sensation normal.  Cranial nerves II-XII intact.   Skin:   Normal overall no scars, lesions, ulcers or rashes. No psoriasis.  Psychiatric: Alert and oriented x 3.  Recent memory intact, remote memory unclear.  Normal mood and affect. Well groomed.  Good eye contact.  Cardiovascular: overall no swelling, no varicosities, no edema bilaterally, normal temperatures of the legs and arms, no clubbing, cyanosis and good capillary refill.  Lymphatic: palpation is normal.  Right knee is tender, effusion, crepitus, positive medial McMurray, ROM 0 to 110, NV intact.  Limp right.  All other systems reviewed and are negative   The patient has been educated about the nature of the problem(s) and counseled on treatment options.  The patient appeared to understand what I have discussed and is in agreement with it.  Encounter Diagnosis  Name Primary?  . Chronic pain of right knee Yes    PLAN Call if any problems.  Precautions discussed.  Continue current medications.   Return to clinic 1 month   Get MRI of the right knee.  Electronically Signed Sanjuana Kava, MD 11/9/20219:06 AM

## 2020-01-04 NOTE — Telephone Encounter (Signed)
Zani Kyllonen check on MRI with Dr Aline Brochure or Dr Amedeo Kinsman get in sooner if you need. Per Dr Luna Glasgow

## 2020-01-06 ENCOUNTER — Encounter (HOSPITAL_COMMUNITY): Payer: Self-pay

## 2020-01-06 ENCOUNTER — Other Ambulatory Visit: Payer: Self-pay

## 2020-01-06 ENCOUNTER — Ambulatory Visit (HOSPITAL_COMMUNITY): Payer: BC Managed Care – PPO

## 2020-01-06 DIAGNOSIS — R29898 Other symptoms and signs involving the musculoskeletal system: Secondary | ICD-10-CM

## 2020-01-06 DIAGNOSIS — M25561 Pain in right knee: Secondary | ICD-10-CM | POA: Diagnosis not present

## 2020-01-06 DIAGNOSIS — M6281 Muscle weakness (generalized): Secondary | ICD-10-CM

## 2020-01-06 DIAGNOSIS — R2689 Other abnormalities of gait and mobility: Secondary | ICD-10-CM

## 2020-01-06 NOTE — Therapy (Signed)
[place], [place], [address] Phone: [phone]   Fax:  [phone]  Physical Therapy Treatment  Patient Details  Name: Louis [name] MRN: [id number] Date of Birth: 1961-02-09 Referring Provider (PT): Sanjuana Kava MD   Encounter Date: 01/06/2020   PT End of Session - 01/06/20 0901    Visit Number 3    Number of Visits 8    Date for PT Re-Evaluation 01/26/20    Authorization Type BCBS (no auth, VL 60 PT/OT)    PT Start Time 0830    PT Stop Time 0908    PT Time Calculation (min) 38 min    Activity Tolerance Patient tolerated treatment well    Behavior During Therapy [facility] for tasks assessed/performed           History reviewed. No pertinent past medical history.  Past Surgical History:  Procedure Laterality Date  . ANKLE FRACTURE SURGERY     left    There were no vitals filed for this visit.   Subjective Assessment - 01/06/20 0828    Subjective Pt stated he has began the home exercises, no reports of pain currently.    Patient Stated Goals avoid surgery    Currently in Pain? No/denies                             [facility] Adult PT Treatment/Exercise - 01/06/20 0001      Exercises   Exercises Knee/Hip      Knee/Hip Exercises: Stretches   Active Hamstring Stretch 2 reps;30 seconds    Active Hamstring Stretch Limitations standing 12in step height      Knee/Hip Exercises: Standing   Heel Raises 15 reps    Knee Flexion 10 reps    Hip Abduction 10 reps    Lateral Step Up Both;10 reps;Hand Hold: 1;Step Height: 4"    Forward Step Up Right;10 reps;Hand Hold: 1;Hand Hold: 0;Step Height: 4"    Functional Squat 10 reps    Functional Squat Limitations decreased Lt ankle DF; cueing for pressure on heels    Wall Squat 10 reps;3 seconds    SLS Rt 25" max of 3    Other Standing Knee Exercises tandem stance 1x 30" solid; foam 2x 30"      Knee/Hip Exercises: Seated   Sit to Sand 10  reps;without UE support   eccentric control                   PT Short Term Goals - 12/29/19 1131      PT SHORT TERM GOAL #1   Title Patient will be independent with HEP in order to improve functional outcomes.    Time 2    Period Weeks    Status New    Target Date 01/12/20      PT SHORT TERM GOAL #2   Title Patient will report at least 25% improvement in symptoms for improved quality of life.    Time 2    Period Weeks    Status New    Target Date 01/12/20             PT Long Term Goals - 12/29/19 1226      PT LONG TERM GOAL #1   Title Patient will report at least 75% improvement in symptoms for improved quality of life.    Time 4    Period Weeks    Status New  Target Date 01/26/20      PT LONG TERM GOAL #2   Title Patient will improve FOTO score by at least 5 points in order to indicate improved tolerance to activity.    Time 4    Period Weeks    Status New    Target Date 01/26/20      PT LONG TERM GOAL #3   Title Patient will be [name] to navigate stairs with reciprocal pattern without compensation on RLE in order to demonstrate improved LE strength and motor control.    Time 4    Period Weeks    Status New    Target Date 01/26/20                 Plan - 01/06/20 0902    Clinical Impression Statement Progressed functional strengthening to standing exercises.  Pt tolerated well to exercises though does reports some pain anterior knee wiht knee extension based exercises, pain resolved following exercise.  Pt with impaired Lt ankle mobility that did require cueing for form and mechanics to reduce strain on anterior knees.  No reoprts of increased pain at EOS.    Personal Factors and Comorbidities Fitness;Profession    Examination-Activity Limitations Transfers;Stairs;Squat;Locomotion Level    Examination-Participation Restrictions Occupation;Yard Work;Community Activity    Stability/Clinical Decision Making Stable/Uncomplicated    Clinical  Decision Making Low    Rehab Potential Good    PT Frequency 2x / week    PT Duration 4 weeks    PT Treatment/Interventions ADLs/Self Care Home Management;Cryotherapy;Electrical Stimulation;DME Instruction;Ultrasound;Iontophoresis 4mg /ml Dexamethasone;Moist Heat;Traction;Gait training;Stair training;Functional mobility training;Therapeutic activities;Therapeutic exercise;Balance training;Neuromuscular re-education;Patient/family education;Orthotic Fit/Training;Manual techniques;Passive range of motion;Dry needling;Energy conservation;Splinting;Taping;Joint Manipulations    PT Next Visit Plan continue LE strengtheing with emphasis on quads, functional strengthing with squats, steps, lunges, etc.    PT Home Exercise Plan 11/3 sit to stand, LAQ 01/04/20: bridge, quad set, slr, sideying hip abduction, heel raises           Patient will benefit from skilled therapeutic intervention in order to improve the following deficits and impairments:  Difficulty walking, Decreased endurance, Pain, Improper body mechanics, Decreased mobility, Decreased strength  Visit Diagnosis: Right knee pain, unspecified chronicity  Muscle weakness (generalized)  Other abnormalities of gait and mobility  Other symptoms and signs involving the musculoskeletal system     Problem List Patient Active Problem List   Diagnosis Date Noted  . ARTHROPATHY NOS, UNSPECIFIED SITE 01/14/2006   Ihor Austin, LPTA/CLT; CBIS [phone]  Aldona Lento 01/06/2020, 9:24 AM  [place] [address] [place], [place], [address] Phone: [phone]   Fax:  [phone]  Name: Louis [name] MRN: [id number] Date of Birth: 06-16-1960

## 2020-01-11 ENCOUNTER — Encounter (HOSPITAL_COMMUNITY): Payer: Self-pay | Admitting: Physical Therapy

## 2020-01-11 ENCOUNTER — Other Ambulatory Visit: Payer: Self-pay

## 2020-01-11 ENCOUNTER — Ambulatory Visit (HOSPITAL_COMMUNITY): Payer: BC Managed Care – PPO | Admitting: Physical Therapy

## 2020-01-11 DIAGNOSIS — R29898 Other symptoms and signs involving the musculoskeletal system: Secondary | ICD-10-CM

## 2020-01-11 DIAGNOSIS — M6281 Muscle weakness (generalized): Secondary | ICD-10-CM | POA: Diagnosis not present

## 2020-01-11 DIAGNOSIS — R2689 Other abnormalities of gait and mobility: Secondary | ICD-10-CM

## 2020-01-11 DIAGNOSIS — M25561 Pain in right knee: Secondary | ICD-10-CM

## 2020-01-11 NOTE — Patient Instructions (Signed)
Access Code: [email or web address] URL: [email or web address] Date: 01/11/2020 Prepared by: Mitzi Hansen Ninetta Adelstein  Exercises Lateral Step Down - 1 x daily - 7 x weekly - 2 sets - 10 reps

## 2020-01-11 NOTE — Therapy (Signed)
[place], [place], [address] Phone: [phone]   Fax:  [phone]  Physical Therapy Treatment  Patient Details  Name: Louis [name] MRN: [id number] Date of Birth: 1960-04-13 Referring Provider (PT): Sanjuana Kava MD   Encounter Date: 01/11/2020   PT End of Session - 01/11/20 0832    Visit Number 4    Number of Visits 8    Date for PT Re-Evaluation 01/26/20    Authorization Type BCBS (no auth, VL 60 PT/OT)    PT Start Time 0833    PT Stop Time 0912    PT Time Calculation (min) 39 min    Activity Tolerance Patient tolerated treatment well    Behavior During Therapy [facility] for tasks assessed/performed           History reviewed. No pertinent past medical history.  Past Surgical History:  Procedure Laterality Date  . ANKLE FRACTURE SURGERY     left    There were no vitals filed for this visit.   Subjective Assessment - 01/11/20 0834    Subjective Patient states his knee isn't any worse. Stairs may be a little better. A little ache every now and then. His home exercises are going well.    Patient Stated Goals avoid surgery    Currently in Pain? No/denies                             [facility] Adult PT Treatment/Exercise - 01/11/20 0001      Knee/Hip Exercises: Machines for Strengthening   Cybex Leg Press 30# 1x15      Knee/Hip Exercises: Standing   Forward Lunges Both;2 sets;10 reps    Lateral Step Up Both;10 reps;Hand Hold: 1;Step Height: 4";2 sets    Forward Step Up Both;1 set;10 reps;Step Height: 6"    Forward Step Up Limitations with contralateral knee drive    Functional Squat 10 reps    Functional Squat Limitations decreased Lt ankle DF; cueing for pressure on heels    Other Standing Knee Exercises lateral stepping with mini squat 4x15 feet    Other Standing Knee Exercises lateral stepping with palof press 2x5 bilateral green band      Knee/Hip Exercises: Seated   Sit to  Sand 10 reps;without UE support   eccentric control                  PT Education - 01/11/20 0833    Education Details exercise mechanics, HEP    Person(s) Educated Patient    Methods Explanation;Demonstration    Comprehension Verbalized understanding;Returned demonstration            PT Short Term Goals - 12/29/19 1131      PT SHORT TERM GOAL #1   Title Patient will be independent with HEP in order to improve functional outcomes.    Time 2    Period Weeks    Status New    Target Date 01/12/20      PT SHORT TERM GOAL #2   Title Patient will report at least 25% improvement in symptoms for improved quality of life.    Time 2    Period Weeks    Status New    Target Date 01/12/20             PT Long Term Goals - 12/29/19 1226      PT LONG TERM GOAL #1   Title Patient will  report at least 75% improvement in symptoms for improved quality of life.    Time 4    Period Weeks    Status New    Target Date 01/26/20      PT LONG TERM GOAL #2   Title Patient will improve FOTO score by at least 5 points in order to indicate improved tolerance to activity.    Time 4    Period Weeks    Status New    Target Date 01/26/20      PT LONG TERM GOAL #3   Title Patient will be able to navigate stairs with reciprocal pattern without compensation on RLE in order to demonstrate improved LE strength and motor control.    Time 4    Period Weeks    Status New    Target Date 01/26/20                 Plan - 01/11/20 1610    Clinical Impression Statement Patient with weight shift off R knee with squatting which improves with UE support and cueing for depth/decreased weight shift. Patient with decreased DF on L ankle with squatting secondary to prior injury, unable to correct form with cueing. Patient with decreased eccentric control with lateral step down. Patient with slight knee discomfort with lunge but demonstrates fair mechanics without UE support. Patient requires  bilateral finger support for step up with contralateral knee drive secondary to impaired balance.  Patient will continue to benefit from skilled physical therapy in order to reduce impairment and improve function.    Personal Factors and Comorbidities Fitness;Profession    Examination-Activity Limitations Transfers;Stairs;Squat;Locomotion Level    Examination-Participation Restrictions Occupation;Yard Work;Community Activity    Stability/Clinical Decision Making Stable/Uncomplicated    Rehab Potential Good    PT Frequency 2x / week    PT Duration 4 weeks    PT Treatment/Interventions ADLs/Self Care Home Management;Cryotherapy;Electrical Stimulation;DME Instruction;Ultrasound;Iontophoresis 4mg /ml Dexamethasone;Moist Heat;Traction;Gait training;Stair training;Functional mobility training;Therapeutic activities;Therapeutic exercise;Balance training;Neuromuscular re-education;Patient/family education;Orthotic Fit/Training;Manual techniques;Passive range of motion;Dry needling;Energy conservation;Splinting;Taping;Joint Manipulations    PT Next Visit Plan continue LE strengtheing with emphasis on quads, functional strengthing with squats, steps, lunges, etc.    PT Home Exercise Plan 11/3 sit to stand, LAQ 01/04/20: bridge, quad set, slr, sideying hip abduction, heel raises 11/16 lateral step down           Patient will benefit from skilled therapeutic intervention in order to improve the following deficits and impairments:  Difficulty walking, Decreased endurance, Pain, Improper body mechanics, Decreased mobility, Decreased strength  Visit Diagnosis: Right knee pain, unspecified chronicity  Muscle weakness (generalized)  Other abnormalities of gait and mobility  Other symptoms and signs involving the musculoskeletal system     Problem List Patient Active Problem List   Diagnosis Date Noted  . ARTHROPATHY NOS, UNSPECIFIED SITE 01/14/2006   9:09 AM, 01/11/20 Mearl Latin PT,  DPT Physical Therapist at [place], [place], [address] Phone: [phone]   Fax:  [phone]  Name: Louis [name] MRN: [id number] Date of Birth: 25-Dec-1960

## 2020-01-12 NOTE — Telephone Encounter (Signed)
Not scheduled yet

## 2020-01-13 ENCOUNTER — Ambulatory Visit (HOSPITAL_COMMUNITY): Payer: BC Managed Care – PPO | Admitting: Physical Therapy

## 2020-01-13 ENCOUNTER — Other Ambulatory Visit: Payer: Self-pay

## 2020-01-13 DIAGNOSIS — M25561 Pain in right knee: Secondary | ICD-10-CM | POA: Diagnosis not present

## 2020-01-13 DIAGNOSIS — M6281 Muscle weakness (generalized): Secondary | ICD-10-CM | POA: Diagnosis not present

## 2020-01-13 DIAGNOSIS — R29898 Other symptoms and signs involving the musculoskeletal system: Secondary | ICD-10-CM | POA: Diagnosis not present

## 2020-01-13 DIAGNOSIS — R2689 Other abnormalities of gait and mobility: Secondary | ICD-10-CM | POA: Diagnosis not present

## 2020-01-13 NOTE — Therapy (Signed)
[place], [place], [address] Phone: [phone]   Fax:  [phone]  Physical Therapy Treatment  Patient Details  Name: Louis [name] MRN: [id number] Date of Birth: 03/28/1960 Referring Provider (PT): Sanjuana Kava MD   Encounter Date: 01/13/2020   PT End of Session - 01/13/20 0935    Visit Number 5    Number of Visits 8    Date for PT Re-Evaluation 01/26/20    Authorization Type BCBS (no auth, VL 60 PT/OT)    PT Start Time 0836    PT Stop Time 0918    PT Time Calculation (min) 42 min    Activity Tolerance Patient tolerated treatment well    Behavior During Therapy [facility] for tasks assessed/performed           No past medical history on file.  Past Surgical History:  Procedure Laterality Date  . ANKLE FRACTURE SURGERY     left    There were no vitals filed for this visit.   Subjective Assessment - 01/13/20 0838    Subjective pt states he is doing well today and being compliant with HEP.    Currently in Pain? No/denies                             [facility] Adult PT Treatment/Exercise - 01/13/20 0001      Knee/Hip Exercises: Machines for Strengthening   Cybex Leg Press 30# 30 reps      Knee/Hip Exercises: Standing   Heel Raises 20 reps    Knee Flexion 20 reps    Forward Lunges Both;2 sets;15 reps    Forward Lunges Limitations onto 4" step    Hip Abduction 15 reps    Lateral Step Up Both;Hand Hold: 1;Step Height: 4";20 reps    Forward Step Up Both;Step Height: 6";20 reps    Forward Step Up Limitations with contralateral knee drive    Functional Squat 20 reps    Functional Squat Limitations decreased Lt ankle DF; cueing for pressure on heels    SLS with Vectors 10X5" holds with 1 HHA each LE    Other Standing Knee Exercises lateral stepping with mini squat 4x15 feet                    PT Short Term Goals - 12/29/19 1131      PT SHORT TERM GOAL #1   Title Patient  will be independent with HEP in order to improve functional outcomes.    Time 2    Period Weeks    Status New    Target Date 01/12/20      PT SHORT TERM GOAL #2   Title Patient will report at least 25% improvement in symptoms for improved quality of life.    Time 2    Period Weeks    Status New    Target Date 01/12/20             PT Long Term Goals - 12/29/19 1226      PT LONG TERM GOAL #1   Title Patient will report at least 75% improvement in symptoms for improved quality of life.    Time 4    Period Weeks    Status New    Target Date 01/26/20      PT LONG TERM GOAL #2   Title Patient will improve FOTO score by at least 5 points in order  to indicate improved tolerance to activity.    Time 4    Period Weeks    Status New    Target Date 01/26/20      PT LONG TERM GOAL #3   Title Patient will be able to navigate stairs with reciprocal pattern without compensation on RLE in order to demonstrate improved LE strength and motor control.    Time 4    Period Weeks    Status New    Target Date 01/26/20                 Plan - 01/13/20 0935    Clinical Impression Statement Pt with overall reduction in symptoms today.  Began with standing exercises.  Increased reps of most exercises this session.  Began vectors to work on glute strength and stability.  Noted challenge with this taking a short rest break around rep 7/8.  Increased to 4" step for lunges to reduce knee discomfort and improve mechanics.   Pt without pain behaviors during session and no complaints of pain.    Personal Factors and Comorbidities Fitness;Profession    Examination-Activity Limitations Transfers;Stairs;Squat;Locomotion Level    Examination-Participation Restrictions Occupation;Yard Work;Community Activity    Stability/Clinical Decision Making Stable/Uncomplicated    Rehab Potential Good    PT Frequency 2x / week    PT Duration 4 weeks    PT Treatment/Interventions ADLs/Self Care Home  Management;Cryotherapy;Electrical Stimulation;DME Instruction;Ultrasound;Iontophoresis 4mg /ml Dexamethasone;Moist Heat;Traction;Gait training;Stair training;Functional mobility training;Therapeutic activities;Therapeutic exercise;Balance training;Neuromuscular re-education;Patient/family education;Orthotic Fit/Training;Manual techniques;Passive range of motion;Dry needling;Energy conservation;Splinting;Taping;Joint Manipulations    PT Next Visit Plan continue to progress LE functional strength.    PT Home Exercise Plan 11/3 sit to stand, LAQ 01/04/20: bridge, quad set, slr, sideying hip abduction, heel raises 11/16 lateral step down           Patient will benefit from skilled therapeutic intervention in order to improve the following deficits and impairments:  Difficulty walking, Decreased endurance, Pain, Improper body mechanics, Decreased mobility, Decreased strength  Visit Diagnosis: Right knee pain, unspecified chronicity  Muscle weakness (generalized)  Other abnormalities of gait and mobility  Other symptoms and signs involving the musculoskeletal system     Problem List Patient Active Problem List   Diagnosis Date Noted  . ARTHROPATHY NOS, UNSPECIFIED SITE 01/14/2006   Teena Irani, PTA/CLT [phone]  Teena Irani 01/13/2020, 9:39 AM  [place] [address] [place], [place], [address] Phone: [phone]   Fax:  [phone]  Name: Louis [name] MRN: [id number] Date of Birth: [birth year]

## 2020-01-18 ENCOUNTER — Encounter (HOSPITAL_COMMUNITY): Payer: Self-pay | Admitting: Physical Therapy

## 2020-01-18 ENCOUNTER — Other Ambulatory Visit: Payer: Self-pay

## 2020-01-18 ENCOUNTER — Ambulatory Visit (HOSPITAL_COMMUNITY): Payer: BC Managed Care – PPO | Admitting: Physical Therapy

## 2020-01-18 DIAGNOSIS — M6281 Muscle weakness (generalized): Secondary | ICD-10-CM | POA: Diagnosis not present

## 2020-01-18 DIAGNOSIS — R2689 Other abnormalities of gait and mobility: Secondary | ICD-10-CM | POA: Diagnosis not present

## 2020-01-18 DIAGNOSIS — R29898 Other symptoms and signs involving the musculoskeletal system: Secondary | ICD-10-CM | POA: Diagnosis not present

## 2020-01-18 DIAGNOSIS — M25561 Pain in right knee: Secondary | ICD-10-CM | POA: Diagnosis not present

## 2020-01-18 NOTE — Therapy (Signed)
[place], [place], [address] Phone: [phone]   Fax:  [phone]  Physical Therapy Treatment  Patient Details  Name: Louis [name] MRN: [id number] Date of Birth: [birth year] Referring Provider (PT): Sanjuana Kava MD   Encounter Date: 01/18/2020   PT End of Session - 01/18/20 1128    Visit Number 6    Number of Visits 8    Date for PT Re-Evaluation 01/26/20    Authorization Type BCBS (no auth, VL 60 PT/OT)    PT Start Time 1130    PT Stop Time 1210    PT Time Calculation (min) 40 min    Activity Tolerance Patient tolerated treatment well    Behavior During Therapy [facility] for tasks assessed/performed           History reviewed. No pertinent past medical history.  Past Surgical History:  Procedure Laterality Date   ANKLE FRACTURE SURGERY     left    There were no vitals filed for this visit.   Subjective Assessment - 01/18/20 1318    Subjective Reports no pain but overall continues to feel about the same. State his exercises are going well.              [facility] PT Assessment - 01/18/20 0001      Assessment   Medical Diagnosis Chronic Pain of R Knee    Referring Provider (PT) Sanjuana Kava MD    Onset Date/Surgical Date 08/28/19    Next MD Visit 11/19                         [place] Adult PT Treatment/Exercise - 01/18/20 0001      Knee/Hip Exercises: Stretches   Other Knee/Hip Stretches thomas stretch x3 45" holds R       Knee/Hip Exercises: Standing   Other Standing Knee Exercises eccentric step down - tolerated after manual work - 4x5     Other Standing Knee Exercises decline squats - board too challenging - increased heel height with towel - x25 total       Knee/Hip Exercises: Seated   Other Seated Knee/Hip Exercises self mobilization to right patell - tolerated welllself quad mobilization with mobilization stick - tolerated well       Knee/Hip Exercises: Supine    Bridges 3 sets;10 reps;Strengthening      Manual Therapy   Manual Therapy Soft tissue mobilization;Joint mobilization    Manual therapy comments performed independently of other interventions    Joint Mobilization STM to right quad and patella tendon    Soft tissue mobilization R patella mobilizations in inferior direction. - tolerated well                   PT Education - 01/18/20 1320    Education Details on exercises and anatomy. How and what to focus on.    Person(s) Educated Patient    Methods Explanation    Comprehension Verbalized understanding            PT Short Term Goals - 12/29/19 1131      PT SHORT TERM GOAL #1   Title Patient will be independent with HEP in order to improve functional outcomes.    Time 2    Period Weeks    Status New    Target Date 01/12/20      PT SHORT TERM GOAL #2   Title Patient will report at least 25% improvement  in symptoms for improved quality of life.    Time 2    Period Weeks    Status New    Target Date 01/12/20             PT Long Term Goals - 12/29/19 1226      PT LONG TERM GOAL #1   Title Patient will report at least 75% improvement in symptoms for improved quality of life.    Time 4    Period Weeks    Status New    Target Date 01/26/20      PT LONG TERM GOAL #2   Title Patient will improve FOTO score by at least 5 points in order to indicate improved tolerance to activity.    Time 4    Period Weeks    Status New    Target Date 01/26/20      PT LONG TERM GOAL #3   Title Patient will be able to navigate stairs with reciprocal pattern without compensation on RLE in order to demonstrate improved LE strength and motor control.    Time 4    Period Weeks    Status New    Target Date 01/26/20                 Plan - 01/18/20 1133    Clinical Impression Statement Patient tolerated session very well. Focused on STM to right patella and patella mobilizations. Returned to decline steps and patient  reported reduced pain after manual work. Practiced steps downs and educated patient in self mobilization. Will continue/follow up with manual work next session pending patient presentation and continue with eccentric loading.    Personal Factors and Comorbidities Fitness;Profession    Examination-Activity Limitations Transfers;Stairs;Squat;Locomotion Level    Examination-Participation Restrictions Occupation;Yard Work;Community Activity    Stability/Clinical Decision Making Stable/Uncomplicated    Rehab Potential Good    PT Frequency 2x / week    PT Duration 4 weeks    PT Treatment/Interventions ADLs/Self Care Home Management;Cryotherapy;Electrical Stimulation;DME Instruction;Ultrasound;Iontophoresis 4mg /ml Dexamethasone;Moist Heat;Traction;Gait training;Stair training;Functional mobility training;Therapeutic activities;Therapeutic exercise;Balance training;Neuromuscular re-education;Patient/family education;Orthotic Fit/Training;Manual techniques;Passive range of motion;Dry needling;Energy conservation;Splinting;Taping;Joint Manipulations    PT Next Visit Plan continue to progress LE functional strength.    PT Home Exercise Plan 11/3 sit to stand, LAQ 01/04/20: bridge, quad set, slr, sideying hip abduction, heel raises 11/16 lateral step down; 11/23 self mobilization, patella mobilization           Patient will benefit from skilled therapeutic intervention in order to improve the following deficits and impairments:  Difficulty walking, Decreased endurance, Pain, Improper body mechanics, Decreased mobility, Decreased strength  Visit Diagnosis: Right knee pain, unspecified chronicity  Muscle weakness (generalized)  Other abnormalities of gait and mobility  Other symptoms and signs involving the musculoskeletal system     Problem List Patient Active Problem List   Diagnosis Date Noted   ARTHROPATHY NOS, UNSPECIFIED SITE 01/14/2006    1:21 PM, 01/18/20 Jerene Pitch, DPT Physical  Therapy with [facility]  [phone] office   [place] [address] [place], [place], [address] Phone: [phone]   Fax:  [phone]  Name: Louis [name] MRN: [id number] Date of Birth: Dec 31, 1960

## 2020-01-19 NOTE — Telephone Encounter (Signed)
Patient has not returned calls to schedule MRI

## 2020-01-25 ENCOUNTER — Other Ambulatory Visit: Payer: Self-pay

## 2020-01-25 ENCOUNTER — Encounter (HOSPITAL_COMMUNITY): Payer: Self-pay

## 2020-01-25 ENCOUNTER — Ambulatory Visit (HOSPITAL_COMMUNITY): Payer: BC Managed Care – PPO

## 2020-01-25 DIAGNOSIS — M6281 Muscle weakness (generalized): Secondary | ICD-10-CM

## 2020-01-25 DIAGNOSIS — M25561 Pain in right knee: Secondary | ICD-10-CM

## 2020-01-25 DIAGNOSIS — R29898 Other symptoms and signs involving the musculoskeletal system: Secondary | ICD-10-CM | POA: Diagnosis not present

## 2020-01-25 DIAGNOSIS — R2689 Other abnormalities of gait and mobility: Secondary | ICD-10-CM

## 2020-01-25 NOTE — Therapy (Signed)
[place], [place], [address] Phone: [phone]   Fax:  [phone]  Physical Therapy Treatment  Patient Details  Name: Louis [name] MRN: [id number] Date of Birth: [birth year] Referring Provider (PT): Sanjuana Kava MD   Encounter Date: 01/25/2020   PT End of Session - 01/25/20 0843    Visit Number 7    Number of Visits 8    Date for PT Re-Evaluation 01/26/20    Authorization Type BCBS (no auth, VL 60 PT/OT)    PT Start Time [id number]   pt late for apt   PT Stop Time 0916    PT Time Calculation (min) 38 min    Activity Tolerance Patient tolerated treatment well    Behavior During Therapy [facility] for tasks assessed/performed           History reviewed. No pertinent past medical history.  Past Surgical History:  Procedure Laterality Date  . ANKLE FRACTURE SURGERY     left    There were no vitals filed for this visit.   Subjective Assessment - 01/25/20 0842    Subjective Pt stated knee is feeling well today, no reports of pain currently.  Continues to have difficulty descending stairs.    Patient Stated Goals avoid surgery    Currently in Pain? No/denies                             [facility] Adult PT Treatment/Exercise - 01/25/20 0001      Exercises   Exercises Knee/Hip      Knee/Hip Exercises: Stretches   Other Knee/Hip Stretches thomas stretch x3 30" holds R       Knee/Hip Exercises: Machines for Strengthening   Cybex Leg Press 40# 2x 10 reps      Knee/Hip Exercises: Standing   Forward Lunges Right;3 sets;5 reps    Forward Lunges Limitations slid on towel anterior, lateral and posterior    Step Down 2 sets;10 reps;Hand Hold: 1;Step Height: 6"    Step Down Limitations eccentric control    Functional Squat 2 sets;10 reps    Functional Squat Limitations declined slope with towel under Lt heel      Knee/Hip Exercises: Supine   Bridges 3 sets;10 reps;Strengthening      Manual  Therapy   Manual Therapy Soft tissue mobilization;Joint mobilization    Manual therapy comments Manual complete separate than rest of tx    Joint Mobilization STM to right quad and patella tendon    Soft tissue mobilization R patella mobilizations in inferior direction. - tolerated well                     PT Short Term Goals - 12/29/19 1131      PT SHORT TERM GOAL #1   Title Patient will be independent with HEP in order to improve functional outcomes.    Time 2    Period Weeks    Status New    Target Date 01/12/20      PT SHORT TERM GOAL #2   Title Patient will report at least 25% improvement in symptoms for improved quality of life.    Time 2    Period Weeks    Status New    Target Date 01/12/20             PT Long Term Goals - 12/29/19 1226      PT LONG TERM GOAL #  1   Title Patient will report at least 75% improvement in symptoms for improved quality of life.    Time 4    Period Weeks    Status New    Target Date 01/26/20      PT LONG TERM GOAL #2   Title Patient will improve FOTO score by at least 5 points in order to indicate improved tolerance to activity.    Time 4    Period Weeks    Status New    Target Date 01/26/20      PT LONG TERM GOAL #3   Title Patient will be able to navigate stairs with reciprocal pattern without compensation on RLE in order to demonstrate improved LE strength and motor control.    Time 4    Period Weeks    Status New    Target Date 01/26/20                 Plan - 01/25/20 0912    Clinical Impression Statement Continued session focus with eccentric quad control.  STM complete to Rt quad and patella tendon for pain control.  Noted improved stance/WBing during squats following manual, modified position due to Lt ankle impaired mobility.  Added sliding lunge exercises with cueing for posterior chain activation to reduce anterior knee pain, improved mechanics with each set.  No reports of increased pain through  session.  Continue to focus on eccentric quad strengthening.    Personal Factors and Comorbidities Fitness;Profession    Examination-Activity Limitations Transfers;Stairs;Squat;Locomotion Level    Examination-Participation Restrictions Occupation;Yard Work;Community Activity    Stability/Clinical Decision Making Stable/Uncomplicated    Clinical Decision Making Low    Rehab Potential Good    PT Frequency 2x / week    PT Duration 4 weeks    PT Treatment/Interventions ADLs/Self Care Home Management;Cryotherapy;Electrical Stimulation;DME Instruction;Ultrasound;Iontophoresis 4mg /ml Dexamethasone;Moist Heat;Traction;Gait training;Stair training;Functional mobility training;Therapeutic activities;Therapeutic exercise;Balance training;Neuromuscular re-education;Patient/family education;Orthotic Fit/Training;Manual techniques;Passive range of motion;Dry needling;Energy conservation;Splinting;Taping;Joint Manipulations    PT Next Visit Plan Review goals next session.  Continue to progress LE functional strength.    PT Home Exercise Plan 11/3 sit to stand, LAQ 01/04/20: bridge, quad set, slr, sideying hip abduction, heel raises 11/16 lateral step down; 11/23 self mobilization, patella mobilization           Patient will benefit from skilled therapeutic intervention in order to improve the following deficits and impairments:  Difficulty walking, Decreased endurance, Pain, Improper body mechanics, Decreased mobility, Decreased strength  Visit Diagnosis: Right knee pain, unspecified chronicity  Muscle weakness (generalized)  Other abnormalities of gait and mobility  Other symptoms and signs involving the musculoskeletal system     Problem List Patient Active Problem List   Diagnosis Date Noted  . ARTHROPATHY NOS, UNSPECIFIED SITE 01/14/2006   Ihor Austin, LPTA/CLT; CBIS [phone]  Aldona Lento 01/25/2020, 9:30 AM  [place], [place], [address] Phone: [phone]   Fax:  [phone]  Name: Antwon Rochin MRN: [id number] Date of Birth: [birth year]

## 2020-01-27 ENCOUNTER — Encounter (HOSPITAL_COMMUNITY): Payer: Self-pay | Admitting: Physical Therapy

## 2020-01-27 ENCOUNTER — Ambulatory Visit (HOSPITAL_COMMUNITY): Payer: BC Managed Care – PPO | Attending: Orthopaedic Surgery | Admitting: Physical Therapy

## 2020-01-27 ENCOUNTER — Other Ambulatory Visit: Payer: Self-pay

## 2020-01-27 DIAGNOSIS — M25561 Pain in right knee: Secondary | ICD-10-CM

## 2020-01-27 DIAGNOSIS — R29898 Other symptoms and signs involving the musculoskeletal system: Secondary | ICD-10-CM

## 2020-01-27 DIAGNOSIS — M6281 Muscle weakness (generalized): Secondary | ICD-10-CM | POA: Diagnosis not present

## 2020-01-27 DIAGNOSIS — R2689 Other abnormalities of gait and mobility: Secondary | ICD-10-CM | POA: Diagnosis not present

## 2020-01-27 NOTE — Therapy (Signed)
[place], [place], [address] Phone: [phone]   Fax:  [phone]  Physical Therapy Treatment/ Progress Note/Recert  Patient Details  Name: Louis [name] MRN: [id number] Date of Birth: 07/03/1960 Referring Provider (PT): Sanjuana Kava MD   Encounter Date: 01/27/2020  Progress Note   Reporting Period 12/29/19 to 01/27/20   See note below for Objective Data and Assessment of Progress/Goals    PT End of Session - 01/27/20 0834    Visit Number 8    Number of Visits 14    Date for PT Re-Evaluation 02/24/20    Authorization Type BCBS (no auth, VL 60 PT/OT)    PT Start Time 0833    PT Stop Time 0916    PT Time Calculation (min) 43 min    Activity Tolerance Patient tolerated treatment well    Behavior During Therapy [facility] for tasks assessed/performed           History reviewed. No pertinent past medical history.  Past Surgical History:  Procedure Laterality Date  . ANKLE FRACTURE SURGERY     left    There were no vitals filed for this visit.   Subjective Assessment - 01/27/20 0834    Subjective Patient states his knee is about the same but not as bad as it was. He feels that therapy has helped him. He still has the same problem but not as bad. He feels about 20% improvement since starting therapy. He is scheduled for an MRI December 9. He still has pain with going up stairs but more coming down. His home exercises are going well.    Patient Stated Goals avoid surgery    Currently in Pain? No/denies              [facility] PT Assessment - 01/27/20 0001      Assessment   Medical Diagnosis Chronic Pain of R Knee    Referring Provider (PT) Sanjuana Kava MD    Onset Date/Surgical Date 08/28/19    Next MD Visit 11/19      Precautions   Precautions None      Restrictions   Weight Bearing Restrictions No      Balance Screen   Has the patient fallen in the past 6 months No    Has the patient had a  decrease in activity level because of a fear of falling?  No    Is the patient reluctant to leave their home because of a fear of falling?  No      Prior Function   Level of Independence Independent    Vocation Full time employment    Vocation Requirements [employer]      Cognition   Overall Cognitive Status Within Functional Limits for tasks assessed      Observation/Other Assessments   Observations Ambulates without AD    Focus on Therapeutic Outcomes (FOTO)  6% limited      Palpation   Palpation comment sore R patellar tendon      Special Tests   Other special tests Forward step down test: R impaired quad and hip strenght/motor control requiring UE support;  L difficulty due to L ankle stiffness      Ambulation/Gait   Stairs Yes    Stairs Assistance 7: Independent    Stair Management Technique No rails    Number of Stairs 8    Height of Stairs 7    Gait Comments decreased eccentric control with stairs bilaterally, anterior  knee pain                         [facility] Adult PT Treatment/Exercise - 01/27/20 0001      Knee/Hip Exercises: Standing   Step Down 2 sets;10 reps;Hand Hold: 1;Step Height: 4"    Step Down Limitations eccentric control    SLS with Vectors 5x with mini squats      Manual Therapy   Manual Therapy Soft tissue mobilization    Manual therapy comments Manual complete separate than rest of tx    Soft tissue mobilization STM to R patellar tendon                  PT Education - 01/27/20 0833    Education Details Patient educated on HEP, exercise mechanics    Person(s) Educated Patient    Methods Explanation;Demonstration    Comprehension Verbalized understanding;Returned demonstration            PT Short Term Goals - 01/27/20 0841      PT SHORT TERM GOAL #1   Title Patient will be independent with HEP in order to improve functional outcomes.    Time 2    Period Weeks    Status Achieved    Target Date 01/12/20       PT SHORT TERM GOAL #2   Title Patient will report at least 25% improvement in symptoms for improved quality of life.    Time 2    Period Weeks    Status On-going    Target Date 01/12/20             PT Long Term Goals - 01/27/20 0841      PT LONG TERM GOAL #1   Title Patient will report at least 75% improvement in symptoms for improved quality of life.    Time 4    Period Weeks    Status On-going      PT LONG TERM GOAL #2   Title Patient will improve FOTO score by at least 5 points in order to indicate improved tolerance to activity.    Time 4    Period Weeks    Status Achieved      PT LONG TERM GOAL #3   Title Patient will be able to navigate stairs with reciprocal pattern without compensation on RLE in order to demonstrate improved LE strength and motor control.    Time 4    Period Weeks    Status New                 Plan - 01/27/20 0834    Clinical Impression Statement Patient has met 1/2 short term goals and 1/3 long term goals with ability to complete HEP and improved activity tolerance as indicated by FOTO score. Remaining goals not met due to continued symptoms and lack of eccentric strength and motor control causing difficulty with stairs and functional mobility. Extending patient's POC to continue with improving R quad strength and motor control. Patient continues to lack eccentric strength with forward step down and requires UE support for balance. Patient requires verbal cueing and demonstration for mechanics of SLS with vectors with mini squat. Ended session with STM to R patellar tendon with patient stating decrease in symptoms following. Patient will continue to benefit from skilled physical therapy in order to reduce impairment and improve function.    Personal Factors and Comorbidities Fitness;Profession    Examination-Activity Limitations Transfers;Stairs;Squat;Locomotion Level  Examination-Participation Restrictions Occupation;Yard Work;Community  Activity    Stability/Clinical Decision Making Stable/Uncomplicated    Rehab Potential Good    PT Frequency --   2x/week for 2 weeks, 1x/week for 2 weeks   PT Duration 4 weeks    PT Treatment/Interventions ADLs/Self Care Home Management;Cryotherapy;Electrical Stimulation;DME Instruction;Ultrasound;Iontophoresis 81m/ml Dexamethasone;Moist Heat;Traction;Gait training;Stair training;Functional mobility training;Therapeutic activities;Therapeutic exercise;Balance training;Neuromuscular re-education;Patient/family education;Orthotic Fit/Training;Manual techniques;Passive range of motion;Dry needling;Energy conservation;Splinting;Taping;Joint Manipulations    PT Next Visit Plan Continue to progress LE functional strength with emphasis on quad strength, STM patellar tendon for pain    PT Home Exercise Plan 11/3 sit to stand, LAQ 01/04/20: bridge, quad set, slr, sideying hip abduction, heel raises 11/16 lateral step down; 11/23 self mobilization, patella mobilization 12/2 forward step down           Patient will benefit from skilled therapeutic intervention in order to improve the following deficits and impairments:  Difficulty walking, Decreased endurance, Pain, Improper body mechanics, Decreased mobility, Decreased strength  Visit Diagnosis: Right knee pain, unspecified chronicity  Muscle weakness (generalized)  Other abnormalities of gait and mobility  Other symptoms and signs involving the musculoskeletal system     Problem List Patient Active Problem List   Diagnosis Date Noted  . ARTHROPATHY NOS, UNSPECIFIED SITE 01/14/2006   9:25 AM, 01/27/20 AMearl LatinPT, DPT [place] [address]Phone: [phone]  Fax:  [phone] Name: ZJelani TruebaMRN: [id number] of Birth: [birth year]

## 2020-02-01 ENCOUNTER — Other Ambulatory Visit: Payer: Self-pay

## 2020-02-01 ENCOUNTER — Ambulatory Visit (HOSPITAL_COMMUNITY): Payer: BC Managed Care – PPO | Admitting: Physical Therapy

## 2020-02-01 ENCOUNTER — Ambulatory Visit: Payer: BC Managed Care – PPO | Admitting: Orthopaedic Surgery

## 2020-02-01 DIAGNOSIS — M25561 Pain in right knee: Secondary | ICD-10-CM | POA: Diagnosis not present

## 2020-02-01 DIAGNOSIS — M6281 Muscle weakness (generalized): Secondary | ICD-10-CM | POA: Diagnosis not present

## 2020-02-01 DIAGNOSIS — R2689 Other abnormalities of gait and mobility: Secondary | ICD-10-CM

## 2020-02-01 DIAGNOSIS — R29898 Other symptoms and signs involving the musculoskeletal system: Secondary | ICD-10-CM | POA: Diagnosis not present

## 2020-02-01 NOTE — Therapy (Signed)
[place], [place], [address] Phone: [phone]   Fax:  [phone]  Physical Therapy Treatment  Patient Details  Name: Louis [name] MRN: [id number] Date of Birth: Nov 12, 1960 Referring Provider (PT): Sanjuana Kava MD   Encounter Date: 02/01/2020   PT End of Session - 02/01/20 1227    Visit Number 9    Number of Visits 14    Date for PT Re-Evaluation 02/24/20    Authorization Type BCBS (no auth, VL 60 PT/OT)    PT Start Time 1135    PT Stop Time 1215    PT Time Calculation (min) 40 min    Activity Tolerance Patient tolerated treatment well    Behavior During Therapy [facility] for tasks assessed/performed           No past medical history on file.  Past Surgical History:  Procedure Laterality Date  . ANKLE FRACTURE SURGERY     left    There were no vitals filed for this visit.   Subjective Assessment - 02/01/20 1143    Subjective Pt reports his pain is about the same in his knee.  STates he is tired from working.    Currently in Pain? No/denies    Pain Score 2     Pain Location Knee    Pain Orientation Right    Pain Descriptors / Indicators Aching                             [facility] Adult PT Treatment/Exercise - 02/01/20 0001      Knee/Hip Exercises: Machines for Strengthening   Cybex Leg Press 40# 2x 15 reps      Knee/Hip Exercises: Standing   Forward Lunges Both;2 sets;15 reps    Forward Lunges Limitations onto 4" step    Lateral Step Up Hand Hold: 1;Both;15 reps;2 sets;Step Height: 6"    Lateral Step Up Limitations eccentric lowering    Step Down 2 sets;10 reps;Hand Hold: 1;Step Height: 4"    Step Down Limitations eccentric control    SLS with Vectors 10x with mini squats; sliding opposite LE on washcloth                    PT Short Term Goals - 01/27/20 0841      PT SHORT TERM GOAL #1   Title Patient will be independent with HEP in order to improve functional  outcomes.    Time 2    Period Weeks    Status Achieved    Target Date 01/12/20      PT SHORT TERM GOAL #2   Title Patient will report at least 25% improvement in symptoms for improved quality of life.    Time 2    Period Weeks    Status On-going    Target Date 01/12/20             PT Long Term Goals - 01/27/20 0841      PT LONG TERM GOAL #1   Title Patient will report at least 75% improvement in symptoms for improved quality of life.    Time 4    Period Weeks    Status On-going      PT LONG TERM GOAL #2   Title Patient will improve FOTO score by at least 5 points in order to indicate improved tolerance to activity.    Time 4    Period Weeks  Status Achieved      PT LONG TERM GOAL #3   Title Patient will be able to navigate stairs with reciprocal pattern without compensation on RLE in order to demonstrate improved LE strength and motor control.    Time 4    Period Weeks    Status New                 Plan - 02/01/20 1227    Clinical Impression Statement Continued with focus on improving Rt LE eccentric strength.  Added forward lunges without use of UE to increase stability/glute strength as well.  Vector slides with cues to bend knee to improve form.  General form and foot placement needed to assure safe mechanics with lunges.  Leg press completed at EOS.  Pt without any complaints during or at conclusion of session, however with noted fatigue.    Personal Factors and Comorbidities Fitness;Profession    Examination-Activity Limitations Transfers;Stairs;Squat;Locomotion Level    Examination-Participation Restrictions Occupation;Yard Work;Community Activity    Stability/Clinical Decision Making Stable/Uncomplicated    Rehab Potential Good    PT Frequency --   2x/week for 2 weeks, 1x/week for 2 weeks   PT Duration 4 weeks    PT Treatment/Interventions ADLs/Self Care Home Management;Cryotherapy;Electrical Stimulation;DME Instruction;Ultrasound;Iontophoresis 4mg /ml  Dexamethasone;Moist Heat;Traction;Gait training;Stair training;Functional mobility training;Therapeutic activities;Therapeutic exercise;Balance training;Neuromuscular re-education;Patient/family education;Orthotic Fit/Training;Manual techniques;Passive range of motion;Dry needling;Energy conservation;Splinting;Taping;Joint Manipulations    PT Next Visit Plan Continue to progress LE functional strength with emphasis on quad strength, STM patellar tendon for pain if needed.    PT Home Exercise Plan 11/3 sit to stand, LAQ 01/04/20: bridge, quad set, slr, sideying hip abduction, heel raises 11/16 lateral step down; 11/23 self mobilization, patella mobilization 12/2 forward step down           Patient will benefit from skilled therapeutic intervention in order to improve the following deficits and impairments:  Difficulty walking, Decreased endurance, Pain, Improper body mechanics, Decreased mobility, Decreased strength  Visit Diagnosis: Muscle weakness (generalized)  Right knee pain, unspecified chronicity  Other abnormalities of gait and mobility  Other symptoms and signs involving the musculoskeletal system     Problem List Patient Active Problem List   Diagnosis Date Noted  . ARTHROPATHY NOS, UNSPECIFIED SITE 01/14/2006   Teena Irani, PTA/CLT [phone]  Teena Irani 02/01/2020, 12:28 PM  [place] [address] [place], [place], [address] Phone: [phone]   Fax:  [phone]  Name: Louis [name] MRN: [id number] Date of Birth: 1961/02/12

## 2020-02-02 ENCOUNTER — Encounter (HOSPITAL_COMMUNITY): Payer: BC Managed Care – PPO | Admitting: Physical Therapy

## 2020-02-02 ENCOUNTER — Telehealth (HOSPITAL_COMMUNITY): Payer: Self-pay | Admitting: Physical Therapy

## 2020-02-02 NOTE — Telephone Encounter (Signed)
pt called to cx today's appt due to he has to work

## 2020-02-03 ENCOUNTER — Other Ambulatory Visit: Payer: Self-pay

## 2020-02-03 ENCOUNTER — Ambulatory Visit (HOSPITAL_COMMUNITY)
Admission: RE | Admit: 2020-02-03 | Discharge: 2020-02-03 | Disposition: A | Discharge status: Home / Self Care | Payer: BC Managed Care – PPO | Source: Ambulatory Visit | Attending: Orthopaedic Surgery | Admitting: Orthopaedic Surgery

## 2020-02-03 DIAGNOSIS — M25561 Pain in right knee: Secondary | ICD-10-CM | POA: Insufficient documentation

## 2020-02-03 DIAGNOSIS — G8929 Other chronic pain: Secondary | ICD-10-CM | POA: Diagnosis not present

## 2020-02-03 DIAGNOSIS — M222X1 Patellofemoral disorders, right knee: Secondary | ICD-10-CM | POA: Diagnosis not present

## 2020-02-07 ENCOUNTER — Telehealth (HOSPITAL_COMMUNITY): Payer: Self-pay | Admitting: Physical Therapy

## 2020-02-07 ENCOUNTER — Ambulatory Visit (HOSPITAL_COMMUNITY): Payer: BC Managed Care – PPO | Admitting: Physical Therapy

## 2020-02-07 NOTE — Telephone Encounter (Signed)
pt cancelled via the phone tree

## 2020-02-08 ENCOUNTER — Ambulatory Visit (INDEPENDENT_AMBULATORY_CARE_PROVIDER_SITE_OTHER): Payer: BC Managed Care – PPO | Admitting: Orthopaedic Surgery

## 2020-02-08 ENCOUNTER — Encounter: Payer: Self-pay | Admitting: Orthopaedic Surgery

## 2020-02-08 ENCOUNTER — Other Ambulatory Visit: Payer: Self-pay

## 2020-02-08 VITALS — BP 163/102 | HR 80 | Ht 70.0 in | Wt 245.0 lb

## 2020-02-08 DIAGNOSIS — M222X1 Patellofemoral disorders, right knee: Secondary | ICD-10-CM | POA: Diagnosis not present

## 2020-02-08 DIAGNOSIS — M23221 Derangement of posterior horn of medial meniscus due to old tear or injury, right knee: Secondary | ICD-10-CM

## 2020-02-08 DIAGNOSIS — M7651 Patellar tendinitis, right knee: Secondary | ICD-10-CM | POA: Diagnosis not present

## 2020-02-08 DIAGNOSIS — G8929 Other chronic pain: Secondary | ICD-10-CM

## 2020-02-08 NOTE — Progress Notes (Signed)
Patient Louis [name], male DOB:Jul 01, 1960, 59 y.o. HGD:[id number]  Chief Complaint  Patient presents with  . Knee Pain    Right   . Results    Review MRI right knee    HPI  Louis [name] is a 59 y.o. male who has right knee pain that is not getting any better.  He had MRI which showed:  IMPRESSION: 1. Patellofemoral osteoarthritis with irregular areas of full-thickness cartilage fissuring involving both the medial and lateral patellar facets. There is a 6 mm linear cartilage flap or fragment at the inferior aspect of the medial patellar facet. 2. Intrasubstance degeneration of the posterior horn and body of the medial meniscus. Intermediate signal contacting the inferior articular surface of the posterior horn potentially reflecting a tiny oblique tear. 3. Partial thickness chondral defect involving the lateral aspect of the medial femoral condyle near the intercondylar notch measuring approximately 14 x 8 mm. 4. Moderate-marked patellar tendinosis.  Mild prepatellar bursitis. 5. Small Baker's cyst.  I have explained the findings to him and have shown him a model.  I will have Dr. Aline [name] see him and see if he is a candidate for arthroscopy.  I have independently reviewed the MRI.        Body mass index is 35.15 kg/m.  ROS  Review of Systems  Constitutional: Positive for activity change.  Musculoskeletal: Positive for arthralgias, gait problem and joint swelling.  All other systems reviewed and are negative.   All other systems reviewed and are negative.  The following is a summary of the past history medically, past history surgically, known current medicines, social history and family history.  This information is gathered electronically by the computer from prior information and documentation.  I review this each visit and have found including this information at this point in the chart is beneficial and informative.    History reviewed. No pertinent  past medical history.  Past Surgical History:  Procedure Laterality Date  . ANKLE FRACTURE SURGERY     left    Family History  Problem Relation Age of Onset  . Broken bones Neg Hx   . Diabetes Neg Hx   . Osteoporosis Neg Hx   . Dislocations Neg Hx     Social History Social History   Tobacco Use  . Smoking status: Never Smoker  . Smokeless tobacco: Never Used  Substance Use Topics  . Alcohol use: Yes  . Drug use: No    No Known Allergies  Current Outpatient Medications  Medication Sig Dispense Refill  . naproxen (NAPROSYN) 500 MG tablet Take 1 tablet (500 mg total) by mouth 2 (two) times daily with a meal. 60 tablet 5   No current facility-administered medications for this visit.     Physical Exam  Blood pressure (!) 163/102, pulse 80, height 5\' 10"  (1.778 m), weight 245 lb (111.1 kg).  Constitutional: overall normal hygiene, normal nutrition, well developed, normal grooming, normal body habitus. Assistive device:none  Musculoskeletal: gait and station Limp right, muscle tone and strength are normal, no tremors or atrophy is present.  .  Neurological: coordination overall normal.  Deep tendon reflex/nerve stretch intact.  Sensation normal.  Cranial nerves II-XII intact.   Skin:   Normal overall no scars, lesions, ulcers or rashes. No psoriasis.  Psychiatric: Alert and oriented x 3.  Recent memory intact, remote memory unclear.  Normal mood and affect. Well groomed.  Good eye contact.  Cardiovascular: overall no swelling, no varicosities, no edema bilaterally, normal temperatures of the  legs and arms, no clubbing, cyanosis and good capillary refill.  Lymphatic: palpation is normal.  Right knee is tender, effusion, crepitus, ROM 0 to 110, medial joint line pain and positive medial McMurray.   All other systems reviewed and are negative   The patient has been educated about the nature of the problem(s) and counseled on treatment options.  The patient appeared  to understand what I have discussed and is in agreement with it.   PLAN Call if any problems.  Precautions discussed.  Continue current medications.   Return to clinic to see Dr. Aline [name].   Electronically Signed Sanjuana Kava, MD 12/14/20219:42 AM

## 2020-02-09 ENCOUNTER — Encounter (HOSPITAL_COMMUNITY): Payer: Self-pay | Admitting: Physical Therapy

## 2020-02-09 ENCOUNTER — Encounter (HOSPITAL_COMMUNITY): Payer: Self-pay

## 2020-02-09 ENCOUNTER — Ambulatory Visit (HOSPITAL_COMMUNITY): Payer: BC Managed Care – PPO

## 2020-02-09 ENCOUNTER — Telehealth (HOSPITAL_COMMUNITY): Payer: Self-pay

## 2020-02-09 NOTE — Telephone Encounter (Signed)
pt called to cx this appt.pt going to have knee surgery done

## 2020-02-09 NOTE — Therapy (Signed)
[place], [place], [address] Phone: [phone]   Fax:  [phone]  Patient Details  Name: Louis [name] MRN: [id number] Date of Birth: [birth year] Referring Provider:  No ref. provider found  Encounter Date: 02/09/2020   PHYSICAL THERAPY DISCHARGE SUMMARY  Visits from Start of Care: 9  Current functional level related to goals / functional outcomes: As of last progress note, Patient has met 1/2 short term goals and 1/3 long term goals with ability to complete HEP and improved activity tolerance as indicated by FOTO score.    Remaining deficits:  Remaining goals not met due to continued symptoms and lack of eccentric strength and motor control causing difficulty with stairs and functional mobility. Education / Equipment: HEP  Plan: Patient agrees to discharge.  Patient goals were partially met. Patient is being discharged due to the patient's request.  ?????     Patient wished to cancel remaining appointments as he is going to have knee surgery.    8:58 AM, 02/09/20 Mearl Latin PT, DPT Physical Therapist at [place] [address] [place], [place], [address] Phone: [phone]   Fax:  [phone]

## 2020-02-14 ENCOUNTER — Ambulatory Visit (HOSPITAL_COMMUNITY): Payer: BC Managed Care – PPO | Admitting: Physical Therapy

## 2020-02-23 ENCOUNTER — Other Ambulatory Visit: Payer: Self-pay

## 2020-02-23 ENCOUNTER — Encounter: Payer: Self-pay | Admitting: Orthopedic Surgery

## 2020-02-23 ENCOUNTER — Ambulatory Visit (INDEPENDENT_AMBULATORY_CARE_PROVIDER_SITE_OTHER): Payer: BC Managed Care – PPO | Admitting: Orthopedic Surgery

## 2020-02-23 ENCOUNTER — Telehealth: Payer: Self-pay | Admitting: Radiology

## 2020-02-23 ENCOUNTER — Encounter (HOSPITAL_COMMUNITY): Payer: BC Managed Care – PPO | Admitting: Physical Therapy

## 2020-02-23 VITALS — BP 153/101 | HR 85 | Ht 70.0 in | Wt 245.0 lb

## 2020-02-23 DIAGNOSIS — M1710 Unilateral primary osteoarthritis, unspecified knee: Secondary | ICD-10-CM

## 2020-02-23 DIAGNOSIS — M1711 Unilateral primary osteoarthritis, right knee: Secondary | ICD-10-CM | POA: Diagnosis not present

## 2020-02-23 DIAGNOSIS — M23321 Other meniscus derangements, posterior horn of medial meniscus, right knee: Secondary | ICD-10-CM | POA: Diagnosis not present

## 2020-02-23 NOTE — Patient Instructions (Signed)
Take naprosyn  Wear brace  Home exercises   We will call u when we can inject the knee

## 2020-02-23 NOTE — Telephone Encounter (Signed)
There is an option for patient to pay, $250 I believe for three shots per knee.  I can tell you the details and we can get patient submitted for this next week when I am back.  This is for the medication Trivisc, a three shot series.

## 2020-02-23 NOTE — Telephone Encounter (Signed)
[employer] new card for January I will email to you Dr Romeo Apple wants to do visco  Please advise, I told him some [employer] plans do not cover I will let you know

## 2020-02-23 NOTE — Progress Notes (Signed)
NEW PROBLEM//OFFICE VISIT  Consult has been requested from Dr. Hilda Lias for possible surgical intervention right knee  Summary assessment and plan:   The patient's MRI shows 3 compartment arthritis most of his pain is in the front of the knee when he is going down or up the steps.  His meniscal tear is very small and probably not causing his symptoms.  Doubt arthroscopy will solve his problem.  We decided to continue with bracing, Naprosyn, home exercises and possible hyaluronic acid injection  Patient's insurance will be checked to see if he is a candidate and we will call him back  Chief Complaint  Patient presents with  . Knee Pain    Right/ surgical consult     59 year old male works at [employer] presented with atraumatic pain in his right knee primarily when going down the steps at work with occasional pain after sitting in the car for long time.  He had course of Naprosyn, 4 weeks of physical therapy, intra-articular injection of cortisone and occasional knee brace eventually had an MRI which showed 3 compartment arthritis with severe arthritis in the patellofemoral joint and the small undersurface tear medial meniscus  He presents for possible surgical intervention     Review of Systems  All other systems reviewed and are negative.    History reviewed. No pertinent past medical history.  Past Surgical History:  Procedure Laterality Date  . ANKLE FRACTURE SURGERY     left    Family History  Problem Relation Age of Onset  . Broken bones Neg Hx   . Diabetes Neg Hx   . Osteoporosis Neg Hx   . Dislocations Neg Hx    Social History   Tobacco Use  . Smoking status: Never Smoker  . Smokeless tobacco: Never Used  Substance Use Topics  . Alcohol use: Yes  . Drug use: No    No Known Allergies  Current Meds  Medication Sig  . naproxen (NAPROSYN) 500 MG tablet Take 1 tablet (500 mg total) by mouth 2 (two) times daily with a meal.    BP (!) 153/101   Pulse 85    Ht 5\' 10"  (1.778 m)   Wt 245 lb (111.1 kg)   BMI 35.15 kg/m   Physical Exam Constitutional:      General: He is not in acute distress.    Appearance: He is well-developed.  Cardiovascular:     Comments: No peripheral edema Skin:    General: Skin is warm and dry.  Neurological:     Mental Status: He is alert and oriented to person, place, and time.     Sensory: No sensory deficit.     Coordination: Coordination normal.     Gait: Gait normal.     Deep Tendon Reflexes: Reflexes are normal and symmetric.     Ortho Exam  Right knee  Tenderness at the posterior medial corner of the medial side of the joint mild tenderness laterally  No effusion  He has full range of motion  His ligaments are stable  He has patellofemoral crepitance tenderness under set of medial and lateral facets with a positive quadriceps contraction test and pain with patella compression   MEDICAL DECISION MAKING  A.  Encounter Diagnoses  Name Primary?  . Primary localized osteoarthritis of knee Yes  . Derangement of posterior horn of medial meniscus of right knee     B. DATA ANALYSED:   IMAGING: Interpretation of images: X-rays taken on 10/26/2019 show normal alignment to the  knee with peaking of the tibial spines but no other signs of arthritis AP lateral and oblique  I see on the MRI from the hospital: 3 sided OA , small tear medial meniscus   Imaging reports  IMPRESSION: 1. Patellofemoral osteoarthritis with irregular areas of full-thickness cartilage fissuring involving both the medial and lateral patellar facets. There is a 6 mm linear cartilage flap or fragment at the inferior aspect of the medial patellar facet. 2. Intrasubstance degeneration of the posterior horn and body of the medial meniscus. Intermediate signal contacting the inferior articular surface of the posterior horn potentially reflecting a tiny oblique tear. 3. Partial thickness chondral defect involving the lateral  aspect of the medial femoral condyle near the intercondylar notch measuring approximately 14 x 8 mm. 4. Moderate-marked patellar tendinosis.  Mild prepatellar bursitis. 5. Small Baker's cyst.     Electronically Signed   By: Davina Poke D.O.   On: 02/04/2020 08:38    No orders of the defined types were placed in this encounter.     Arther Abbott, MD  02/23/2020 10:04 AM

## 2020-03-09 NOTE — Telephone Encounter (Signed)
I have submitted online for patient pay through Trivisc.  They should call patient to discuss.  I have called patient and advised that they will be calling him.  He will let us know if he will proceed with these injections.

## 2020-03-15 ENCOUNTER — Telehealth: Payer: Self-pay | Admitting: Orthopedic Surgery

## 2020-03-15 NOTE — Telephone Encounter (Signed)
We received a call from Louis [name] at [facility].  She said that they received the prescription for  Trivisc 25 mgs for this.  She said the prescription does not list the quantity.  Please call Louis [name] regarding quantity for this medicine at [phone] ext. [phone]  If no answer, it is okay to leave message

## 2020-03-20 NOTE — Telephone Encounter (Signed)
Can you please do me a favor?  Call the number below and advise them 3 syringes are requested? Thanks.

## 2020-03-20 NOTE — Telephone Encounter (Signed)
Left a brief message with the 3 syringes and asked Louis [name] to call the office back. No other concerns.

## 2020-03-21 ENCOUNTER — Telehealth: Payer: Self-pay | Admitting: Orthopedic Surgery

## 2020-03-21 NOTE — Telephone Encounter (Signed)
Lauren from [employer] called and needs some updated information for this patient.    Please call her at [phone].

## 2020-03-21 NOTE — Telephone Encounter (Signed)
I called patient LM advised we received notification that Trivisc Injections will ship tomorrow and we should receive them in 2-3 business days.  I will call him once received to get him scheduled.

## 2020-03-21 NOTE — Telephone Encounter (Signed)
I called, s/w Laurena and she verified the shipping address.  I will call pt to arrange appts.

## 2020-03-27 NOTE — Telephone Encounter (Signed)
Trivisc injections delivered to incorrect office.  I will pick up today.

## 2020-03-27 NOTE — Telephone Encounter (Signed)
Left brief message for patient to call back to set up injections.

## 2020-03-28 NOTE — Telephone Encounter (Signed)
Meds picked up, and patient has been called/scheduled for injections.

## 2020-03-30 ENCOUNTER — Encounter (HOSPITAL_COMMUNITY): Payer: Self-pay | Admitting: Emergency Medicine

## 2020-03-30 ENCOUNTER — Ambulatory Visit (INDEPENDENT_AMBULATORY_CARE_PROVIDER_SITE_OTHER): Payer: BC Managed Care – PPO | Admitting: Orthopedic Surgery

## 2020-03-30 ENCOUNTER — Ambulatory Visit: Payer: BC Managed Care – PPO | Admitting: Orthopedic Surgery

## 2020-03-30 ENCOUNTER — Encounter: Payer: Self-pay | Admitting: Orthopedic Surgery

## 2020-03-30 ENCOUNTER — Other Ambulatory Visit: Payer: Self-pay

## 2020-03-30 ENCOUNTER — Emergency Department (HOSPITAL_COMMUNITY): Payer: BC Managed Care – PPO

## 2020-03-30 ENCOUNTER — Emergency Department (HOSPITAL_COMMUNITY)
Admission: EM | Admit: 2020-03-30 | Discharge: 2020-03-30 | Disposition: A | Discharge status: Home / Self Care | Payer: BC Managed Care – PPO | Attending: Emergency Medicine | Admitting: Emergency Medicine

## 2020-03-30 VITALS — Ht 70.0 in | Wt 244.0 lb

## 2020-03-30 DIAGNOSIS — R072 Precordial pain: Secondary | ICD-10-CM | POA: Diagnosis not present

## 2020-03-30 DIAGNOSIS — M1711 Unilateral primary osteoarthritis, right knee: Secondary | ICD-10-CM | POA: Diagnosis not present

## 2020-03-30 DIAGNOSIS — M1710 Unilateral primary osteoarthritis, unspecified knee: Secondary | ICD-10-CM

## 2020-03-30 DIAGNOSIS — R0789 Other chest pain: Secondary | ICD-10-CM | POA: Diagnosis not present

## 2020-03-30 DIAGNOSIS — R079 Chest pain, unspecified: Secondary | ICD-10-CM | POA: Diagnosis not present

## 2020-03-30 LAB — BASIC METABOLIC PANEL
Anion gap: 8 (ref 5–15)
BUN: 17 mg/dL (ref 8–23)
CO2: 26 mmol/L (ref 22–32)
Calcium: 9.5 mg/dL (ref 8.9–10.3)
Chloride: 104 mmol/L (ref 98–111)
Creatinine, Ser: 1.18 mg/dL (ref 0.61–1.24)
GFR, Estimated: >60 mL/min (ref >60)
Glucose, Bld: 121 mg/dL — ABNORMAL HIGH (ref 70–99)
Potassium: 4.2 mmol/L (ref 3.5–5.1)
Sodium: 138 mmol/L (ref 135–145)

## 2020-03-30 LAB — CBC
HCT: 45.9 % (ref 39.0–52.0)
Hemoglobin: 14.3 g/dL (ref 13.0–17.0)
MCH: 28.9 pg (ref 26.0–34.0)
MCHC: 31.2 g/dL (ref 30.0–36.0)
MCV: 92.7 fL (ref 80.0–100.0)
Platelets: 254 10*3/uL (ref 150–400)
RBC: 4.95 MIL/uL (ref 4.22–5.81)
RDW: 12.4 % (ref 11.5–15.5)
WBC: 7.1 10*3/uL (ref 4.0–10.5)
nRBC: 0 % (ref 0.0–0.2)

## 2020-03-30 LAB — TROPONIN I (HIGH SENSITIVITY)
Troponin I (High Sensitivity): 2 ng/L (ref <18)
Troponin I (High Sensitivity): 2 ng/L (ref <18)

## 2020-03-30 NOTE — ED Notes (Signed)
ED Provider at bedside. 

## 2020-03-30 NOTE — ED Notes (Signed)
Pt placed in a gown & on the cardiac monitor.

## 2020-03-30 NOTE — Progress Notes (Signed)
Chief Complaint  Patient presents with  . Injections    Rt knee Trivisc    Encounter Diagnosis  Name Primary?  . Primary localized osteoarthritis of knee Yes    60 year old male was considering surgery on his right knee  MRI shows severe arthritis.  He opted for continued nonoperative treatment.  Inject right knee with try bisque  Knee was prepped with alcohol and ethyl chloride.  Direct lateral approach was performed with the knee in flexion and 1 vial of try Visco was injected no complications were noted  Patient will return in a week for the second injection  [id number]

## 2020-03-30 NOTE — Discharge Instructions (Addendum)
Follow-up with cardiology in the next few days.  The contact information for the [facility] cardiology clinic has been provided in this discharge summary for you to call and make these arrangements.  Return to the emergency department in the meantime if symptoms significantly worsen or change.

## 2020-03-30 NOTE — ED Triage Notes (Signed)
Pt states he began having sharpe chest pains that radiate to left side of neck while driving home from work tonight. Currently rates pain 4/10. No cardiac hx.

## 2020-03-30 NOTE — ED Provider Notes (Signed)
[facility] Provider Note   CSN: [id number] Arrival date & time: 03/30/20  9030     History Chief Complaint  Patient presents with  . Chest Pain    Louis [name] is a 60 y.o. male.  Patient is a 60 year old male with no significant past medical history. He presents today for evaluation of chest discomfort. Patient works at the [employer] and moves heavy objects throughout the day. While driving home, he noticed discomfort across his upper left chest that lasted for approximately 45 minutes. He describes a tightness with no associated shortness of breath, nausea, or diaphoresis. The pain did seem to go into his left neck. Patient has no prior cardiac history and no cardiac risk factors.  The history is provided by the patient.  Chest Pain Pain location:  Substernal area and L chest Pain quality: tightness   Pain radiates to:  Neck Pain severity:  Moderate Onset quality:  Sudden Timing:  Constant Progression:  Resolved Relieved by:  Nothing Worsened by:  Nothing      History reviewed. No pertinent past medical history.  Patient Active Problem List   Diagnosis Date Noted  . ARTHROPATHY NOS, UNSPECIFIED SITE 01/14/2006    Past Surgical History:  Procedure Laterality Date  . ANKLE FRACTURE SURGERY     left       Family History  Problem Relation Age of Onset  . Broken bones Neg Hx   . Diabetes Neg Hx   . Osteoporosis Neg Hx   . Dislocations Neg Hx     Social History   Tobacco Use  . Smoking status: Never Smoker  . Smokeless tobacco: Never Used  Substance Use Topics  . Alcohol use: Yes  . Drug use: No    Home Medications Prior to Admission medications   Medication Sig Start Date End Date Taking? Authorizing Provider  naproxen (NAPROSYN) 500 MG tablet Take 1 tablet (500 mg total) by mouth 2 (two) times daily with a meal. 10/26/19   Sanjuana Kava, MD    Allergies    Patient has no known allergies.  Review of Systems    Review of Systems  Cardiovascular: Positive for chest pain.  All other systems reviewed and are negative.   Physical Exam Updated Vital Signs BP (!) 145/104   Pulse 79   Temp 97.6 F (36.4 C) (Oral)   Resp 16   Ht 5\' 10"  (1.778 m)   Wt 111 kg   SpO2 100%   BMI 35.11 kg/m   Physical Exam Vitals and nursing note reviewed.  Constitutional:      General: He is not in acute distress.    Appearance: He is well-developed and well-nourished. He is not diaphoretic.  HENT:     Head: Normocephalic and atraumatic.     Mouth/Throat:     Mouth: Oropharynx is clear and moist.  Cardiovascular:     Rate and Rhythm: Normal rate and regular rhythm.     Heart sounds: No murmur heard. No friction rub.  Pulmonary:     Effort: Pulmonary effort is normal. No respiratory distress.     Breath sounds: Normal breath sounds. No wheezing or rales.  Abdominal:     General: Bowel sounds are normal. There is no distension.     Palpations: Abdomen is soft.     Tenderness: There is no abdominal tenderness.  Musculoskeletal:        General: No edema. Normal range of motion.     Cervical back:  Normal range of motion and neck supple.     Right lower leg: No tenderness. No edema.     Left lower leg: No tenderness. No edema.     Comments: Bevelyn Buckles' sign is absent bilaterally.  Skin:    General: Skin is warm and dry.  Neurological:     Mental Status: He is alert and oriented to person, place, and time.     Coordination: Coordination normal.     ED Results / Procedures / Treatments   Labs (all labs ordered are listed, but only abnormal results are displayed) Labs Reviewed  BASIC METABOLIC PANEL - Abnormal; Notable for the following components:      Result Value   Glucose, Bld 121 (*)    All other components within normal limits  CBC  TROPONIN I (HIGH SENSITIVITY)  TROPONIN I (HIGH SENSITIVITY)    EKG EKG Interpretation  Date/Time:  Thursday March 30 2020 00:37:04 EST Ventricular Rate:   91 PR Interval:  166 QRS Duration: 82 QT Interval:  344 QTC Calculation: 423 R Axis:   -9 Text Interpretation: Normal sinus rhythm Left axis deviation No significant change since 04/13/2011 Confirmed by Veryl Speak [phone]) on 03/30/2020 1:16:06 AM   Radiology DG Chest 2 View  Result Date: 03/30/2020 CLINICAL DATA:  Chest pain radiating to neck which began while driving EXAM: CHEST - 2 VIEW COMPARISON:  None. FINDINGS: No consolidation, features of edema, pneumothorax, or effusion. Pulmonary vascularity is normally distributed. The cardiomediastinal contours are unremarkable. No acute osseous or soft tissue abnormality. IMPRESSION: No acute cardiopulmonary abnormality. Electronically Signed   By: Lovena Le M.D.   On: 03/30/2020 01:12    Procedures Procedures   Medications Ordered in ED Medications - No data to display  ED Course  I have reviewed the triage vital signs and the nursing notes.  Pertinent labs & imaging results that were available during my care of the patient were reviewed by me and considered in my medical decision making (see chart for details).    MDM Rules/Calculators/A&P  Patient presenting here with complaints of chest discomfort, the details of which are described in the HPI.  Patient symptoms somewhat atypical for cardiac pain and work-up shows unchanged EKG and negative troponin x2.  At this point, I feel as though discharge is appropriate.  I do feel as though he should follow-up with cardiology to discuss a possible stress test due to the nature of his symptoms.  Patient to be discharged with outpatient cardiology referral and return as needed if symptoms worsen.  Final Clinical Impression(s) / ED Diagnoses Final diagnoses:  None    Rx / DC Orders ED Discharge Orders    None       Veryl Speak, MD 03/30/20 [phone]

## 2020-03-31 ENCOUNTER — Ambulatory Visit: Payer: BC Managed Care – PPO | Admitting: Orthopedic Surgery

## 2020-04-06 ENCOUNTER — Other Ambulatory Visit: Payer: Self-pay

## 2020-04-06 ENCOUNTER — Ambulatory Visit (INDEPENDENT_AMBULATORY_CARE_PROVIDER_SITE_OTHER): Payer: BC Managed Care – PPO | Admitting: Orthopedic Surgery

## 2020-04-06 ENCOUNTER — Encounter: Payer: Self-pay | Admitting: Cardiology

## 2020-04-06 ENCOUNTER — Ambulatory Visit: Payer: BC Managed Care – PPO | Admitting: Cardiology

## 2020-04-06 VITALS — BP 138/90 | HR 96 | Ht 70.0 in | Wt 250.6 lb

## 2020-04-06 DIAGNOSIS — M1711 Unilateral primary osteoarthritis, right knee: Secondary | ICD-10-CM

## 2020-04-06 DIAGNOSIS — R0789 Other chest pain: Secondary | ICD-10-CM | POA: Diagnosis not present

## 2020-04-06 DIAGNOSIS — M1710 Unilateral primary osteoarthritis, unspecified knee: Secondary | ICD-10-CM

## 2020-04-06 NOTE — Progress Notes (Signed)
  Chief Complaint  Patient presents with  . right knee   2/3 trivisc   60 year old male presents for second try disc injection of the right knee  Procedure note for injection of hyaluronic acid   Diagnosis osteoarthritis of the knee  Verbal consent was obtained to inject the knee with HYALURONIC ACID . Timeout was completed to confirm the injection site as the right   knee  Ethyl chloride spray was used for anesthesia Alcohol was used to prep the skin. The infrapatellar lateral portal was used as an injection site and 1 vial of hyaluronic acid  was injected into the knee  Specific Co. Preparation: Try Visco  No complications were noted  Encounter Diagnosis  Name Primary?  . Primary localized osteoarthritis of knee Yes

## 2020-04-06 NOTE — Patient Instructions (Signed)
Medication Instructions:  Your physician recommends that you continue on your current medications as directed. Please refer to the Current Medication list given to you today.  *If you need a refill on your cardiac medications before your next appointment, please call your pharmacy*   Lab Work: NONE   If you have labs (blood work) drawn today and your tests are completely normal, you will receive your results only by: Marland Kitchen MyChart Message (if you have MyChart) OR . A paper copy in the mail If you have any lab test that is abnormal or we need to change your treatment, we will call you to review the results.   Testing/Procedures: NONE    Follow-Up: At [facility], you and your health needs are our priority.  As part of our continuing mission to provide you with exceptional heart care, we have created designated Provider Care Teams.  These Care Teams include your primary Cardiologist (physician) and Advanced Practice Providers (APPs -  Physician Assistants and Nurse Practitioners) who all work together to provide you with the care you need, when you need it.  We recommend signing up for the patient portal called "MyChart".  Sign up information is provided on this After Visit Summary.  MyChart is used to connect with patients for Virtual Visits (Telemedicine).  Patients are able to view lab/test results, encounter notes, upcoming appointments, etc.  Non-urgent messages can be sent to your provider as well.   To learn more about what you can do with MyChart, go to [email or web address].    Your next appointment:    As Needed   The format for your next appointment:   In Person  Provider:   Carlyle Dolly, MD   Other Instructions Thank you for choosing [facility]!

## 2020-04-06 NOTE — Progress Notes (Signed)
     Clinical Summary Mr. Furio is a 60 y.o.male seen as new patient for the following medical problems  1. Chest pain - ER visit 03/30/20 with chest pain.  - trops neg x 2. EKG NSR, no ischemic changes. 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 5/10 in severity. No other associated symptoms. Does not recall if positional. Pain lasted about 1.5 hours.  - mild pains with lifting.    CAD risk factors: age       PMH Arthritis   No Known Allergies   Current Outpatient Medications  Medication Sig Dispense Refill  . naproxen (NAPROSYN) 500 MG tablet Take 1 tablet (500 mg total) by mouth 2 (two) times daily with a meal. 60 tablet 5   No current facility-administered medications for this visit.     Past Surgical History:  Procedure Laterality Date  . ANKLE FRACTURE SURGERY     left     No Known Allergies    Family History  Problem Relation Age of Onset  . Broken bones Neg Hx   . Diabetes Neg Hx   . Osteoporosis Neg Hx   . Dislocations Neg Hx      Social History Mr. Bender reports that he has never smoked. He has never used smokeless tobacco. Mr. Renner reports current alcohol use.   Review of Systems CONSTITUTIONAL: No weight loss, fever, chills, weakness or fatigue.  HEENT: Eyes: No visual loss, blurred vision, double vision or yellow sclerae.No hearing loss, sneezing, congestion, runny nose or sore throat.  SKIN: No rash or itching.  CARDIOVASCULAR: per hpi RESPIRATORY: No shortness of breath, cough or sputum.  GASTROINTESTINAL: No anorexia, nausea, vomiting or diarrhea. No abdominal pain or blood.  GENITOURINARY: No burning on urination, no polyuria NEUROLOGICAL: No headache, dizziness, syncope, paralysis, ataxia, numbness or tingling in the extremities. No change in bowel or bladder control.  MUSCULOSKELETAL: No muscle, back pain, joint pain or stiffness.   LYMPHATICS: No enlarged nodes. No history of splenectomy.  PSYCHIATRIC: No history of depression or anxiety.  ENDOCRINOLOGIC: No reports of sweating, cold or heat intolerance. No polyuria or polydipsia.  Marland Kitchen   Physical Examination Today's Vitals   04/06/20 1302  BP: 138/90  Pulse: 96  SpO2: 98%  Weight: 250 lb 9.6 oz (113.7 kg)  Height: 5\' 10"  (1.778 m)   Body mass index is 35.96 kg/m.  Gen: resting comfortably, no acute distress HEENT: no scleral icterus, pupils equal round and reactive, no palptable cervical adenopathy,  CV: RRR, no m/r/g, no jvd Resp: Clear to auscultation bilaterally GI: abdomen is soft, non-tender, non-distended, normal bowel sounds, no hepatosplenomegaly MSK: extremities are warm, no edema.  Skin: warm, no rash Neuro:  no focal deficits Psych: appropriate affect     Assessment and Plan  1. Chest pain - atypical symptoms. Started after doing heavy lifting at work. Some soreness in clinic today to palpation,to movement of left arm. Despite age limited CAD risk factors. ER workup was benign with EKG and enzymes - monitor at this time, no plans for ischemic testing. F/u as needed.       Arnoldo Lenis, M.D.

## 2020-04-13 ENCOUNTER — Ambulatory Visit (INDEPENDENT_AMBULATORY_CARE_PROVIDER_SITE_OTHER): Payer: BC Managed Care – PPO | Admitting: Orthopedic Surgery

## 2020-04-13 ENCOUNTER — Other Ambulatory Visit: Payer: Self-pay

## 2020-04-13 ENCOUNTER — Encounter: Payer: Self-pay | Admitting: Orthopedic Surgery

## 2020-04-13 DIAGNOSIS — M1711 Unilateral primary osteoarthritis, right knee: Secondary | ICD-10-CM | POA: Diagnosis not present

## 2020-04-13 DIAGNOSIS — M1710 Unilateral primary osteoarthritis, unspecified knee: Secondary | ICD-10-CM

## 2020-04-13 NOTE — Progress Notes (Signed)
Chief Complaint  Patient presents with  . right knee   60 year old male presents for third hyaluronic acid injection right knee so far no problems   3/3 trivisc   60 year old male presents for second try disc injection of the right knee  Procedure note for injection of hyaluronic acid   Diagnosis osteoarthritis of the knee  Verbal consent was obtained to inject the knee with HYALURONIC ACID . Timeout was completed to confirm the injection site as the right   knee  Ethyl chloride spray was used for anesthesia Alcohol was used to prep the skin. The infrapatellar lateral portal was used as an injection site and 1 vial of hyaluronic acid  was injected into the knee  Specific Co. Preparation: Try Visc  No complications were noted      Encounter Diagnosis  Name Primary?  . Primary localized osteoarthritis of knee Yes

## 2020-04-18 ENCOUNTER — Encounter (INDEPENDENT_AMBULATORY_CARE_PROVIDER_SITE_OTHER): Payer: Self-pay | Admitting: *Deleted

## 2020-04-18 DIAGNOSIS — Z1211 Encounter for screening for malignant neoplasm of colon: Secondary | ICD-10-CM | POA: Diagnosis not present

## 2020-04-18 DIAGNOSIS — R739 Hyperglycemia, unspecified: Secondary | ICD-10-CM | POA: Diagnosis not present

## 2020-04-18 DIAGNOSIS — Z0000 Encounter for general adult medical examination without abnormal findings: Secondary | ICD-10-CM | POA: Diagnosis not present

## 2020-04-18 DIAGNOSIS — Z6836 Body mass index (BMI) 36.0-36.9, adult: Secondary | ICD-10-CM | POA: Diagnosis not present

## 2020-06-12 ENCOUNTER — Ambulatory Visit: Payer: BC Managed Care – PPO | Admitting: Orthopedic Surgery

## 2020-06-19 ENCOUNTER — Ambulatory Visit: Payer: BC Managed Care – PPO | Admitting: Orthopedic Surgery

## 2020-06-19 ENCOUNTER — Encounter: Payer: Self-pay | Admitting: Orthopedic Surgery

## 2020-06-19 ENCOUNTER — Other Ambulatory Visit: Payer: Self-pay

## 2020-06-19 VITALS — BP 152/104 | HR 81 | Ht 70.0 in | Wt 250.4 lb

## 2020-06-19 DIAGNOSIS — M1711 Unilateral primary osteoarthritis, right knee: Secondary | ICD-10-CM | POA: Diagnosis not present

## 2020-06-19 DIAGNOSIS — M1710 Unilateral primary osteoarthritis, unspecified knee: Secondary | ICD-10-CM

## 2020-06-19 DIAGNOSIS — M23321 Other meniscus derangements, posterior horn of medial meniscus, right knee: Secondary | ICD-10-CM

## 2020-06-19 NOTE — Progress Notes (Signed)
Chief Complaint  Patient presents with  . Knee Pain    Right knee pain, Patient reports he is some better from where he was     Current Outpatient Medications:  .  naproxen (NAPROSYN) 500 MG tablet, Take 1 tablet (500 mg total) by mouth 2 (two) times daily with a meal., Disp: 60 tablet, Rfl: 5 .  sildenafil (VIAGRA) 50 MG tablet, Take 50 mg by mouth as needed., Disp: , Rfl:  .  TRIVISC 25 MG/2.5ML SOSY, INJECT INTO THE AFFECTED KNEE ONCE WEEKLY AS DIRECTED, Disp: , Rfl:    S/privisc injection right knee  His pain is better and he takes naprosyn only as  Needed   His knee rom is normal   He and I agree that he can continue with naprosyn and see [place] in 6 months for xrays   And ? Poss repeat trivisc

## 2020-06-27 ENCOUNTER — Telehealth (INDEPENDENT_AMBULATORY_CARE_PROVIDER_SITE_OTHER): Payer: Self-pay

## 2020-06-27 ENCOUNTER — Encounter (INDEPENDENT_AMBULATORY_CARE_PROVIDER_SITE_OTHER): Payer: Self-pay

## 2020-06-27 ENCOUNTER — Encounter (INDEPENDENT_AMBULATORY_CARE_PROVIDER_SITE_OTHER): Payer: Self-pay | Admitting: *Deleted

## 2020-06-27 ENCOUNTER — Other Ambulatory Visit (INDEPENDENT_AMBULATORY_CARE_PROVIDER_SITE_OTHER): Payer: Self-pay

## 2020-06-27 DIAGNOSIS — Z1211 Encounter for screening for malignant neoplasm of colon: Secondary | ICD-10-CM

## 2020-06-27 MED ORDER — PEG 3350-KCL-NA BICARB-NACL 420 G PO SOLR: 4000.0000 mL | ORAL | 0 refills | Status: DC

## 2020-06-27 NOTE — Telephone Encounter (Signed)
Ok to schedule.  Thanks,  Ulysses Alper Castaneda Mayorga, MD Gastroenterology and Hepatology [facility]  

## 2020-06-27 NOTE — Telephone Encounter (Signed)
LeighAnn Ziare Orrick, CMA  

## 2020-06-27 NOTE — Telephone Encounter (Signed)
Referring MD/PCP: Abran Richard  Procedure: Tcs  Reason/Indication:  Screening   Has patient had this procedure before?  yes  If so, when, by whom and where? 40yrs ago   Is there a family history of colon cancer?  no  Who?  What age when diagnosed?    Is patient diabetic? If yes, Type 1 or Type 2   no      Does patient have prosthetic heart valve or mechanical valve?  no  Do you have a pacemaker/defibrillator?  no  Has patient ever had endocarditis/atrial fibrillation? no  Have you had a stroke/heart attack last 6 mths? no  Does patient use oxygen? no  Has patient had joint replacement within last 12 months?  no  Is patient constipated or do they take laxatives? no  Does patient have a history of alcohol/drug use?  yes  Is patient on blood thinner such as Coumadin, Plavix and/or Aspirin? no  Do you take medicine for weight loss?  no  For male patients,: do you still have your menstrual cycle? no  Medications: Diclofenac 25mg  1-2 tabs bid, Sildenafil 50 mg prn, Mvi daily  Allergies: nkda  Medication Adjustment per Dr Rehman/Dr Jenetta Downer NONE  Procedure date & time: 07/05/20 at 12:30

## 2020-07-03 ENCOUNTER — Other Ambulatory Visit: Payer: Self-pay

## 2020-07-03 ENCOUNTER — Other Ambulatory Visit (HOSPITAL_COMMUNITY)
Admission: RE | Admit: 2020-07-03 | Discharge: 2020-07-03 | Disposition: A | Discharge status: Home / Self Care | Payer: BC Managed Care – PPO | Source: Ambulatory Visit | Attending: Gastroenterology | Admitting: Gastroenterology

## 2020-07-03 DIAGNOSIS — Z1211 Encounter for screening for malignant neoplasm of colon: Secondary | ICD-10-CM | POA: Diagnosis not present

## 2020-07-03 DIAGNOSIS — Z791 Long term (current) use of non-steroidal anti-inflammatories (NSAID): Secondary | ICD-10-CM | POA: Diagnosis not present

## 2020-07-03 DIAGNOSIS — Z01812 Encounter for preprocedural laboratory examination: Secondary | ICD-10-CM | POA: Insufficient documentation

## 2020-07-03 DIAGNOSIS — K5730 Diverticulosis of large intestine without perforation or abscess without bleeding: Secondary | ICD-10-CM | POA: Diagnosis not present

## 2020-07-03 DIAGNOSIS — Z20822 Contact with and (suspected) exposure to covid-19: Secondary | ICD-10-CM | POA: Insufficient documentation

## 2020-07-03 DIAGNOSIS — D124 Benign neoplasm of descending colon: Secondary | ICD-10-CM | POA: Diagnosis not present

## 2020-07-03 DIAGNOSIS — Z79899 Other long term (current) drug therapy: Secondary | ICD-10-CM | POA: Diagnosis not present

## 2020-07-03 LAB — SARS CORONAVIRUS 2 (TAT 6-24 HRS): SARS Coronavirus 2: NEGATIVE

## 2020-07-05 ENCOUNTER — Encounter (HOSPITAL_COMMUNITY): Payer: Self-pay | Admitting: Gastroenterology

## 2020-07-05 ENCOUNTER — Encounter (HOSPITAL_COMMUNITY)
Admission: RE | Disposition: A | Discharge status: Home / Self Care | Payer: Self-pay | Source: Home / Self Care | Attending: Gastroenterology

## 2020-07-05 ENCOUNTER — Ambulatory Visit (HOSPITAL_COMMUNITY): Payer: BC Managed Care – PPO | Admitting: Anesthesiology

## 2020-07-05 ENCOUNTER — Other Ambulatory Visit: Payer: Self-pay

## 2020-07-05 ENCOUNTER — Ambulatory Visit (HOSPITAL_COMMUNITY)
Admission: RE | Admit: 2020-07-05 | Discharge: 2020-07-05 | Disposition: A | Discharge status: Home / Self Care | Payer: BC Managed Care – PPO | Attending: Gastroenterology | Admitting: Gastroenterology

## 2020-07-05 DIAGNOSIS — K5730 Diverticulosis of large intestine without perforation or abscess without bleeding: Secondary | ICD-10-CM | POA: Diagnosis not present

## 2020-07-05 DIAGNOSIS — Z791 Long term (current) use of non-steroidal anti-inflammatories (NSAID): Secondary | ICD-10-CM | POA: Insufficient documentation

## 2020-07-05 DIAGNOSIS — D124 Benign neoplasm of descending colon: Secondary | ICD-10-CM | POA: Diagnosis not present

## 2020-07-05 DIAGNOSIS — Z20822 Contact with and (suspected) exposure to covid-19: Secondary | ICD-10-CM | POA: Insufficient documentation

## 2020-07-05 DIAGNOSIS — Z1211 Encounter for screening for malignant neoplasm of colon: Secondary | ICD-10-CM | POA: Diagnosis not present

## 2020-07-05 DIAGNOSIS — Z79899 Other long term (current) drug therapy: Secondary | ICD-10-CM | POA: Insufficient documentation

## 2020-07-05 HISTORY — PX: POLYPECTOMY: SHX5525

## 2020-07-05 HISTORY — PX: COLONOSCOPY WITH PROPOFOL: SHX5780

## 2020-07-05 LAB — HM COLONOSCOPY

## 2020-07-05 SURGERY — COLONOSCOPY WITH PROPOFOL
Anesthesia: General

## 2020-07-05 MED ORDER — PROPOFOL 500 MG/50ML IV EMUL
INTRAVENOUS | Status: DC | PRN
  Administered 2020-07-05: 150 ug/kg/min via INTRAVENOUS

## 2020-07-05 MED ORDER — PROPOFOL 10 MG/ML IV BOLUS
INTRAVENOUS | Status: DC | PRN
  Administered 2020-07-05: 50 mg via INTRAVENOUS
  Administered 2020-07-05 (×2): 10 mg via INTRAVENOUS
  Administered 2020-07-05: 40 mg via INTRAVENOUS
  Administered 2020-07-05: 20 mg via INTRAVENOUS

## 2020-07-05 MED ORDER — STERILE WATER FOR IRRIGATION IR SOLN
Status: DC | PRN
  Administered 2020-07-05: 100 mL

## 2020-07-05 MED ORDER — LACTATED RINGERS IV SOLN: INTRAVENOUS | Status: DC

## 2020-07-05 NOTE — H&P (Signed)
Louis [name] is an 60 y.o. male.   Chief Complaint: Screening colonoscopy HPI: 60 year old male with no significant past medical history, coming for screening colonoscopy.  Last colonoscopy was performed 10 years ago, Patient reports it was normal. the patient denies having any complaints such as melena, hematochezia, abdominal pain or distention, change in her bowel movement consistency or frequency, no changes in her weight recently.  No family history of colorectal cancer.  History reviewed. No pertinent past medical history.  Past Surgical History:  Procedure Laterality Date  . ANKLE FRACTURE SURGERY     left    Family History  Problem Relation Age of Onset  . Broken bones Neg Hx   . Diabetes Neg Hx   . Osteoporosis Neg Hx   . Dislocations Neg Hx    Social History:  reports that he has never smoked. He has never used smokeless tobacco. He reports current alcohol use. He reports that he does not use drugs.  Allergies: No Known Allergies  Medications Prior to Admission  Medication Sig Dispense Refill  . diclofenac (VOLTAREN) 25 MG EC tablet Take 25 mg by mouth 2 (two) times daily as needed for moderate pain.    . Garlic 3500 MG CAPS Take 1,000 mg by mouth daily.    . Misc Natural Products (PROSTATE SUPPORT PO) Take 1 capsule by mouth daily.    . naproxen (NAPROSYN) 500 MG tablet Take 1 tablet (500 mg total) by mouth 2 (two) times daily with a meal. (Patient taking differently: Take 500 mg by mouth 2 (two) times daily as needed for moderate pain.) 60 tablet 5  . polyethylene glycol-electrolytes (TRILYTE) 420 g solution Take 4,000 mLs by mouth as directed. 4000 mL 0  . sildenafil (VIAGRA) 50 MG tablet Take 50 mg by mouth as needed for erectile dysfunction.    . trolamine salicylate (ASPERCREME) 10 % cream Apply 1 application topically 2 (two) times daily as needed for muscle pain.      No results found for this or any previous visit (from the past 48 hour(s)). No results  found.  Review of Systems  Constitutional: Negative.   HENT: Negative.   Eyes: Negative.   Respiratory: Negative.   Cardiovascular: Negative.   Gastrointestinal: Negative.   Endocrine: Negative.   Genitourinary: Negative.   Musculoskeletal: Negative.   Skin: Negative.   Allergic/Immunologic: Negative.   Neurological: Negative.   Hematological: Negative.   Psychiatric/Behavioral: Negative.     Blood pressure 131/89, pulse 86, temperature 98 F (36.7 C), temperature source Oral, resp. rate 18, height 5\' 10"  (1.778 m), weight 113.6 kg, SpO2 97 %. Physical Exam  GENERAL: The patient is AO x3, in no acute distress. HEENT: Head is normocephalic and atraumatic. EOMI are intact. Mouth is well hydrated and without lesions. NECK: Supple. No masses LUNGS: Clear to auscultation. No presence of rhonchi/wheezing/rales. Adequate chest expansion HEART: RRR, normal s1 and s2. ABDOMEN: Soft, nontender, no guarding, no peritoneal signs, and nondistended. BS +. No masses. EXTREMITIES: Without any cyanosis, clubbing, rash, lesions or edema. NEUROLOGIC: AOx3, no focal motor deficit. SKIN: no jaundice, no rashes  Assessment/Plan 60 year old male with no significant past medical history, coming for screening colonoscopy. The patient is at average risk for colorectal cancer.  We will proceed with colonoscopy today.   Harvel Quale, MD 07/05/2020, 11:15 AM

## 2020-07-05 NOTE — Anesthesia Postprocedure Evaluation (Signed)
Anesthesia Post Note  Patient: Louis [name]  Procedure(s) Performed: COLONOSCOPY WITH PROPOFOL (N/A ) POLYPECTOMY  Patient location during evaluation: Endoscopy Anesthesia Type: General Level of consciousness: awake and alert Pain management: pain level controlled Vital Signs Assessment: post-procedure vital signs reviewed and stable Respiratory status: spontaneous breathing Cardiovascular status: blood pressure returned to baseline and stable Postop Assessment: no apparent nausea or vomiting Anesthetic complications: no   No complications documented.   Last Vitals:  Vitals:   07/05/20 1106  BP: 131/89  Pulse: 86  Resp: 18  Temp: 36.7 C  SpO2: 97%    Last Pain:  Vitals:   07/05/20 1129  TempSrc:   PainSc: 0-No pain                 Adelheid Hoggard

## 2020-07-05 NOTE — Discharge Instructions (Signed)
You are being discharged to home.  Resume your previous diet.  We are waiting for your pathology results.  Your physician has recommended a repeat colonoscopy for surveillance based on pathology results.    Colonoscopy, Adult, Care After This sheet gives you information about how to care for yourself after your procedure. Your doctor may also give you more specific instructions. If you have problems or questions, call your doctor. What can I expect after the procedure? After the procedure, it is common to have:  A small amount of blood in your poop (stool) for 24 hours.  Some gas.  Mild cramping or bloating in your belly (abdomen). Follow these instructions at home: Eating and drinking  Drink enough fluid to keep your pee (urine) pale yellow.  Follow instructions from your doctor about what you cannot eat or drink.  Return to your normal diet as told by your doctor. Avoid heavy or fried foods that are hard to digest.   Activity  Rest as told by your doctor.  Do not sit for a long time without moving. Get up to take short walks every 1-2 hours. This is important. Ask for help if you feel weak or unsteady.  Return to your normal activities as told by your doctor. Ask your doctor what activities are safe for you. To help cramping and bloating:  Try walking around.  Put heat on your belly as told by your doctor. Use the heat source that your doctor recommends, such as a moist heat pack or a heating pad. ? Put a towel between your skin and the heat source. ? Leave the heat on for 20-30 minutes. ? Remove the heat if your skin turns bright red. This is very important if you are unable to feel pain, heat, or cold. You may have a greater risk of getting burned.   General instructions  If you were given a medicine to help you relax (sedative) during your procedure, it can affect you for many hours. Do not drive or use machinery until your doctor says that it is safe.  For the first  24 hours after the procedure: ? Do not sign important documents. ? Do not drink alcohol. ? Do your daily activities more slowly than normal. ? Eat foods that are soft and easy to digest.  Take over-the-counter or prescription medicines only as told by your doctor.  Keep all follow-up visits as told by your doctor. This is important. Contact a doctor if:  You have blood in your poop 2-3 days after the procedure. Get help right away if:  You have more than a small amount of blood in your poop.  You see large clumps of tissue (blood clots) in your poop.  Your belly is swollen.  You feel like you may vomit (nauseous).  You vomit.  You have a fever.  You have belly pain that gets worse, and medicine does not help your pain. Summary  After the procedure, it is common to have a small amount of blood in your poop. You may also have mild cramping and bloating in your belly.  If you were given a medicine to help you relax (sedative) during your procedure, it can affect you for many hours. Do not drive or use machinery until your doctor says that it is safe.  Get help right away if you have a lot of blood in your poop, feel like you may vomit, have a fever, or have more belly pain. This information is not  intended to replace advice given to you by your health care provider. Make sure you discuss any questions you have with your health care provider. Document Revised: 12/18/2018 Document Reviewed: 09/07/2018 Elsevier Patient Education  [employer].  Colon Polyps  Colon polyps are tissue growths inside the colon, which is part of the large intestine. They are one of the types of polyps that can grow in the body. A polyp may be a round bump or a mushroom-shaped growth. You could have one polyp or more than one. Most colon polyps are noncancerous (benign). However, some colon polyps can become cancerous over time. Finding and removing the polyps early can help prevent this. What are  the causes? The exact cause of colon polyps is not known. What increases the risk? The following factors may make you more likely to develop this condition:  Having a family history of colorectal cancer or colon polyps.  Being older than 62 years of age.  Being younger than 62 years of age and having a significant family history of colorectal cancer or colon polyps or a genetic condition that puts you at higher risk of getting colon polyps.  Having inflammatory bowel disease, such as ulcerative colitis or Crohn's disease.  Having certain conditions passed from parent to child (hereditary conditions), such as: ? Familial adenomatous polyposis (FAP). ? Lynch syndrome. ? Turcot syndrome. ? Peutz-Jeghers syndrome. ? MUTYH-associated polyposis (MAP).  Being overweight.  Certain lifestyle factors. These include smoking cigarettes, drinking too much alcohol, not getting enough exercise, and eating a diet that is high in fat and red meat and low in fiber.  Having had childhood cancer that was treated with radiation of the abdomen. What are the signs or symptoms? Many times, there are no symptoms. If you have symptoms, they may include:  Blood coming from the rectum during a bowel movement.  Blood in the stool (feces). The blood may be bright red or very dark in color.  Pain in the abdomen.  A change in bowel habits, such as constipation or diarrhea. How is this diagnosed? This condition is diagnosed with a colonoscopy. This is a procedure in which a lighted, flexible scope is inserted into the opening between the buttocks (anus) and then passed into the colon to examine the area. Polyps are sometimes found when a colonoscopy is done as part of routine cancer screening tests. How is this treated? This condition is treated by removing any polyps that are found. Most polyps can be removed during a colonoscopy. Those polyps will then be tested for cancer. Additional treatment may be needed  depending on the results of testing. Follow these instructions at home: Eating and drinking  Eat foods that are high in fiber, such as fruits, vegetables, and whole grains.  Eat foods that are high in calcium and vitamin D, such as milk, cheese, yogurt, eggs, liver, fish, and broccoli.  Limit foods that are high in fat, such as fried foods and desserts.  Limit the amount of red meat, precooked or cured meat, or other processed meat that you eat, such as hot dogs, sausages, bacon, or meat loaves.  Limit sugary drinks.   Lifestyle  Maintain a healthy weight, or lose weight if recommended by your health care provider.  Exercise every day or as told by your health care provider.  Do not use any products that contain nicotine or tobacco, such as cigarettes, e-cigarettes, and chewing tobacco. If you need help quitting, ask your health care provider.  Do  not drink alcohol if: ? Your health care provider tells you not to drink. ? You are pregnant, may be pregnant, or are planning to become pregnant.  If you drink alcohol: ? Limit how much you use to:  0-1 drink a day for women.  0-2 drinks a day for men. ? Know how much alcohol is in your drink. In the [place] one drink equals one 12 oz bottle of beer (355 mL), one 5 oz glass of wine (148 mL), or one 1 oz glass of hard liquor (44 mL). General instructions  Take over-the-counter and prescription medicines only as told by your health care provider.  Keep all follow-up visits. This is important. This includes having regularly scheduled colonoscopies. Talk to your health care provider about when you need a colonoscopy. Contact a health care provider if:  You have new or worsening bleeding during a bowel movement.  You have new or increased blood in your stool.  You have a change in bowel habits.  You lose weight for no known reason. Summary  Colon polyps are tissue growths inside the colon, which is part of the large intestine.  They are one type of polyp that can grow in the body.  Most colon polyps are noncancerous (benign), but some can become cancerous over time.  This condition is diagnosed with a colonoscopy.  This condition is treated by removing any polyps that are found. Most polyps can be removed during a colonoscopy. This information is not intended to replace advice given to you by your health care provider. Make sure you discuss any questions you have with your health care provider. Document Revised: 06/02/2019 Document Reviewed: 06/02/2019 Elsevier Patient Education  2021 [employer].

## 2020-07-05 NOTE — Anesthesia Preprocedure Evaluation (Addendum)
Anesthesia Evaluation  Patient identified by MRN, date of birth, ID band Patient awake    Reviewed: Allergy & Precautions, NPO status , Patient's Chart, lab work & pertinent test results  History of Anesthesia Complications Negative for: history of anesthetic complications  Airway Mallampati: II  TM Distance: >3 FB Neck ROM: Full    Dental  (+) Dental Advisory Given, Teeth Intact   Pulmonary neg pulmonary ROS,    Pulmonary exam normal breath sounds clear to auscultation       Cardiovascular Exercise Tolerance: Good Normal cardiovascular exam Rhythm:Regular Rate:Normal     Neuro/Psych negative neurological ROS  negative psych ROS   GI/Hepatic negative GI ROS, Neg liver ROS,   Endo/Other  negative endocrine ROS  Renal/GU negative Renal ROS     Musculoskeletal negative musculoskeletal ROS (+)   Abdominal   Peds  Hematology negative hematology ROS (+)   Anesthesia Other Findings Snoring   Reproductive/Obstetrics negative OB ROS                            Anesthesia Physical Anesthesia Plan  ASA: II  Anesthesia Plan: General   Post-op Pain Management:    Induction: Intravenous  PONV Risk Score and Plan: Propofol infusion  Airway Management Planned: Nasal Cannula and Natural Airway  Additional Equipment:   Intra-op Plan:   Post-operative Plan:   Informed Consent: I have reviewed the patients History and Physical, chart, labs and discussed the procedure including the risks, benefits and alternatives for the proposed anesthesia with the patient or authorized representative who has indicated his/her understanding and acceptance.     Dental advisory given  Plan Discussed with: CRNA and Surgeon  Anesthesia Plan Comments:         Anesthesia Quick Evaluation

## 2020-07-05 NOTE — Transfer of Care (Signed)
Immediate Anesthesia Transfer of Care Note  Patient: Louis [name]  Procedure(s) Performed: COLONOSCOPY WITH PROPOFOL (N/A ) POLYPECTOMY  Patient Location: Endoscopy Unit  Anesthesia Type:General  Level of Consciousness: awake  Airway & Oxygen Therapy: Patient Spontanous Breathing  Post-op Assessment: Report given to RN  Post vital signs: Reviewed and stable  Last Vitals:  Vitals Value Taken Time  BP    Temp    Pulse    Resp    SpO2      Last Pain:  Vitals:   07/05/20 1129  TempSrc:   PainSc: 0-No pain      Patients Stated Pain Goal: 7 (15/94/58 [id number])  Complications: No complications documented.

## 2020-07-05 NOTE — Op Note (Signed)
[facility] Patient Name: Louis [name] Procedure Date: 07/05/2020 11:07 AM MRN: [id number] Date of Birth: Jul 08, 1960 Attending MD: Maylon Peppers ,  CSN: [id number] Age: 60 Admit Type: Outpatient Procedure:                Colonoscopy Indications:              Screening for colorectal malignant neoplasm Providers:                Maylon Peppers, Lambert Mody, [place]                            Risa Grill, Technician Referring MD:              Medicines:                Monitored Anesthesia Care Complications:            No immediate complications. Estimated Blood Loss:     Estimated blood loss: none. Procedure:                Pre-Anesthesia Assessment:                           - Prior to the procedure, a History and Physical                            was performed, and patient medications, allergies                            and sensitivities were reviewed. The patient's                            tolerance of previous anesthesia was reviewed.                           - The risks and benefits of the procedure and the                            sedation options and risks were discussed with the                            patient. All questions were answered and informed                            consent was obtained.                           - ASA Grade Assessment: II - A patient with mild                            systemic disease.                           After obtaining informed consent, the colonoscope                            was passed under direct vision. Throughout the  procedure, the patient's blood pressure, pulse, and                            oxygen saturations were monitored continuously.The                            colonoscopy was performed without difficulty. The                            patient tolerated the procedure well. The quality                            of the bowel preparation was good. The PCF-[id number]                             ([id number]) scope was introduced through the anus and                            advanced to the the cecum, identified by                            appendiceal orifice and ileocecal valve. Scope In: 11:32:47 AM Scope Out: 11:54:34 AM Scope Withdrawal Time: 0 hours 17 minutes 19 seconds  Total Procedure Duration: 0 hours 21 minutes 47 seconds  Findings:      The perianal and digital rectal examinations were normal.      A few medium-mouthed diverticula were found in the ascending colon.      A 5 mm polyp was found in the descending colon. The polyp was sessile.       The polyp was removed with a cold snare. Resection and retrieval were       complete.      The retroflexed view of the distal rectum and anal verge was normal and       showed no anal or rectal abnormalities. Impression:               - Diverticulosis in the ascending colon.                           - One 5 mm polyp in the descending colon, removed                            with a cold snare. Resected and retrieved.                           - The distal rectum and anal verge are normal on                            retroflexion view. Moderate Sedation:      Per Anesthesia Care Recommendation:           - Discharge patient to home (ambulatory).                           - Resume previous diet.                           -  Await pathology results.                           - Repeat colonoscopy for surveillance based on                            pathology results. Procedure Code(s):        --- Professional ---                           [phone], Colonoscopy, flexible; with removal of                            tumor(s), polyp(s), or other lesion(s) by snare                            technique Diagnosis Code(s):        --- Professional ---                           Z12.11, Encounter for screening for malignant                            neoplasm of colon                           K63.5, Polyp of  colon                           K57.30, Diverticulosis of large intestine without                            perforation or abscess without bleeding CPT copyright 2019 American Medical Association. All rights reserved. The codes documented in this report are preliminary and upon coder review may  be revised to meet current compliance requirements. Maylon Peppers, MD Maylon Peppers,  07/05/2020 11:57:18 AM This report has been signed electronically. Number of Addenda: 0

## 2020-07-06 LAB — SURGICAL PATHOLOGY

## 2020-07-11 ENCOUNTER — Encounter (HOSPITAL_COMMUNITY): Payer: Self-pay | Admitting: Gastroenterology

## 2020-07-18 ENCOUNTER — Encounter (INDEPENDENT_AMBULATORY_CARE_PROVIDER_SITE_OTHER): Payer: Self-pay | Admitting: *Deleted

## 2020-12-21 ENCOUNTER — Encounter: Payer: Self-pay | Admitting: Orthopedic Surgery

## 2020-12-21 ENCOUNTER — Other Ambulatory Visit: Payer: Self-pay

## 2020-12-21 ENCOUNTER — Ambulatory Visit: Payer: BC Managed Care – PPO

## 2020-12-21 ENCOUNTER — Ambulatory Visit: Payer: BC Managed Care – PPO | Admitting: Orthopedic Surgery

## 2020-12-21 VITALS — BP 144/96 | HR 76 | Ht 70.5 in | Wt 252.6 lb

## 2020-12-21 DIAGNOSIS — M25561 Pain in right knee: Secondary | ICD-10-CM | POA: Diagnosis not present

## 2020-12-21 DIAGNOSIS — G8929 Other chronic pain: Secondary | ICD-10-CM

## 2020-12-21 NOTE — Progress Notes (Signed)
Chief Complaint  Patient presents with   Knee Pain    RIGHT/ follow up   Louis [name] has had an MRI and x-rays of his right knee he has chronic osteoarthritis however he had an Orthovisc injection and he did well.  He only takes naproxen every so often he is doing well with his knee  X-rays were taken today to evaluate the joint surface and compared to his last x-ray which show no significant change in his joint line with most of his x-ray findings seen in the MRI images  We recommend he continue with his naproxen and then see [place] again in a year for x-ray

## 2021-12-18 DIAGNOSIS — Z6836 Body mass index (BMI) 36.0-36.9, adult: Secondary | ICD-10-CM | POA: Insufficient documentation

## 2021-12-18 DIAGNOSIS — N529 Male erectile dysfunction, unspecified: Secondary | ICD-10-CM | POA: Insufficient documentation

## 2021-12-18 DIAGNOSIS — G8929 Other chronic pain: Secondary | ICD-10-CM | POA: Insufficient documentation

## 2021-12-18 DIAGNOSIS — M25561 Pain in right knee: Secondary | ICD-10-CM | POA: Insufficient documentation

## 2021-12-18 DIAGNOSIS — M19072 Primary osteoarthritis, left ankle and foot: Secondary | ICD-10-CM | POA: Insufficient documentation

## 2021-12-20 ENCOUNTER — Ambulatory Visit: Payer: BC Managed Care – PPO | Admitting: Orthopedic Surgery

## 2021-12-24 ENCOUNTER — Ambulatory Visit: Payer: BC Managed Care – PPO | Admitting: Orthopedic Surgery

## 2022-01-03 ENCOUNTER — Ambulatory Visit (INDEPENDENT_AMBULATORY_CARE_PROVIDER_SITE_OTHER): Payer: BC Managed Care – PPO

## 2022-01-03 ENCOUNTER — Ambulatory Visit: Payer: BC Managed Care – PPO | Admitting: Orthopedic Surgery

## 2022-01-03 ENCOUNTER — Encounter: Payer: Self-pay | Admitting: Orthopedic Surgery

## 2022-01-03 DIAGNOSIS — G8929 Other chronic pain: Secondary | ICD-10-CM | POA: Diagnosis not present

## 2022-01-03 DIAGNOSIS — M25561 Pain in right knee: Secondary | ICD-10-CM

## 2022-01-03 NOTE — Progress Notes (Signed)
Follow-up visit  Encounter Diagnosis  Name Primary?   Chronic pain of right knee Yes    [name] has no symptoms at this time he is doing well.  His gait was normal his knee exam was benign his x-rays look good  I included his MRI IMPRESSION: 1. Patellofemoral osteoarthritis with irregular areas of full-thickness cartilage fissuring involving both the medial and lateral patellar facets. There is a 6 mm linear cartilage flap or fragment at the inferior aspect of the medial patellar facet. 2. Intrasubstance degeneration of the posterior horn and body of the medial meniscus. Intermediate signal contacting the inferior articular surface of the posterior horn potentially reflecting a tiny oblique tear. 3. Partial thickness chondral defect involving the lateral aspect of the medial femoral condyle near the intercondylar notch measuring approximately 14 x 8 mm. 4. Moderate-marked patellar tendinosis. Mild prepatellar bursitis. 5. Small Baker's cyst.   Electronically Signed By: Davina Poke D.O. On: 02/04/2020 08:38  Based on his current symptoms and his x-ray I think we can see him in a year but he is in good condition

## 2022-01-04 ENCOUNTER — Telehealth: Payer: Self-pay | Admitting: Orthopaedic Surgery

## 2022-09-23 IMAGING — DX DG CHEST 2V
2 series · 2 of 2 positions shown · non-contrast
Comparison: None.

CLINICAL DATA: Chest pain radiating to neck which began while
driving

EXAM:
CHEST - 2 VIEW

[chest pa]
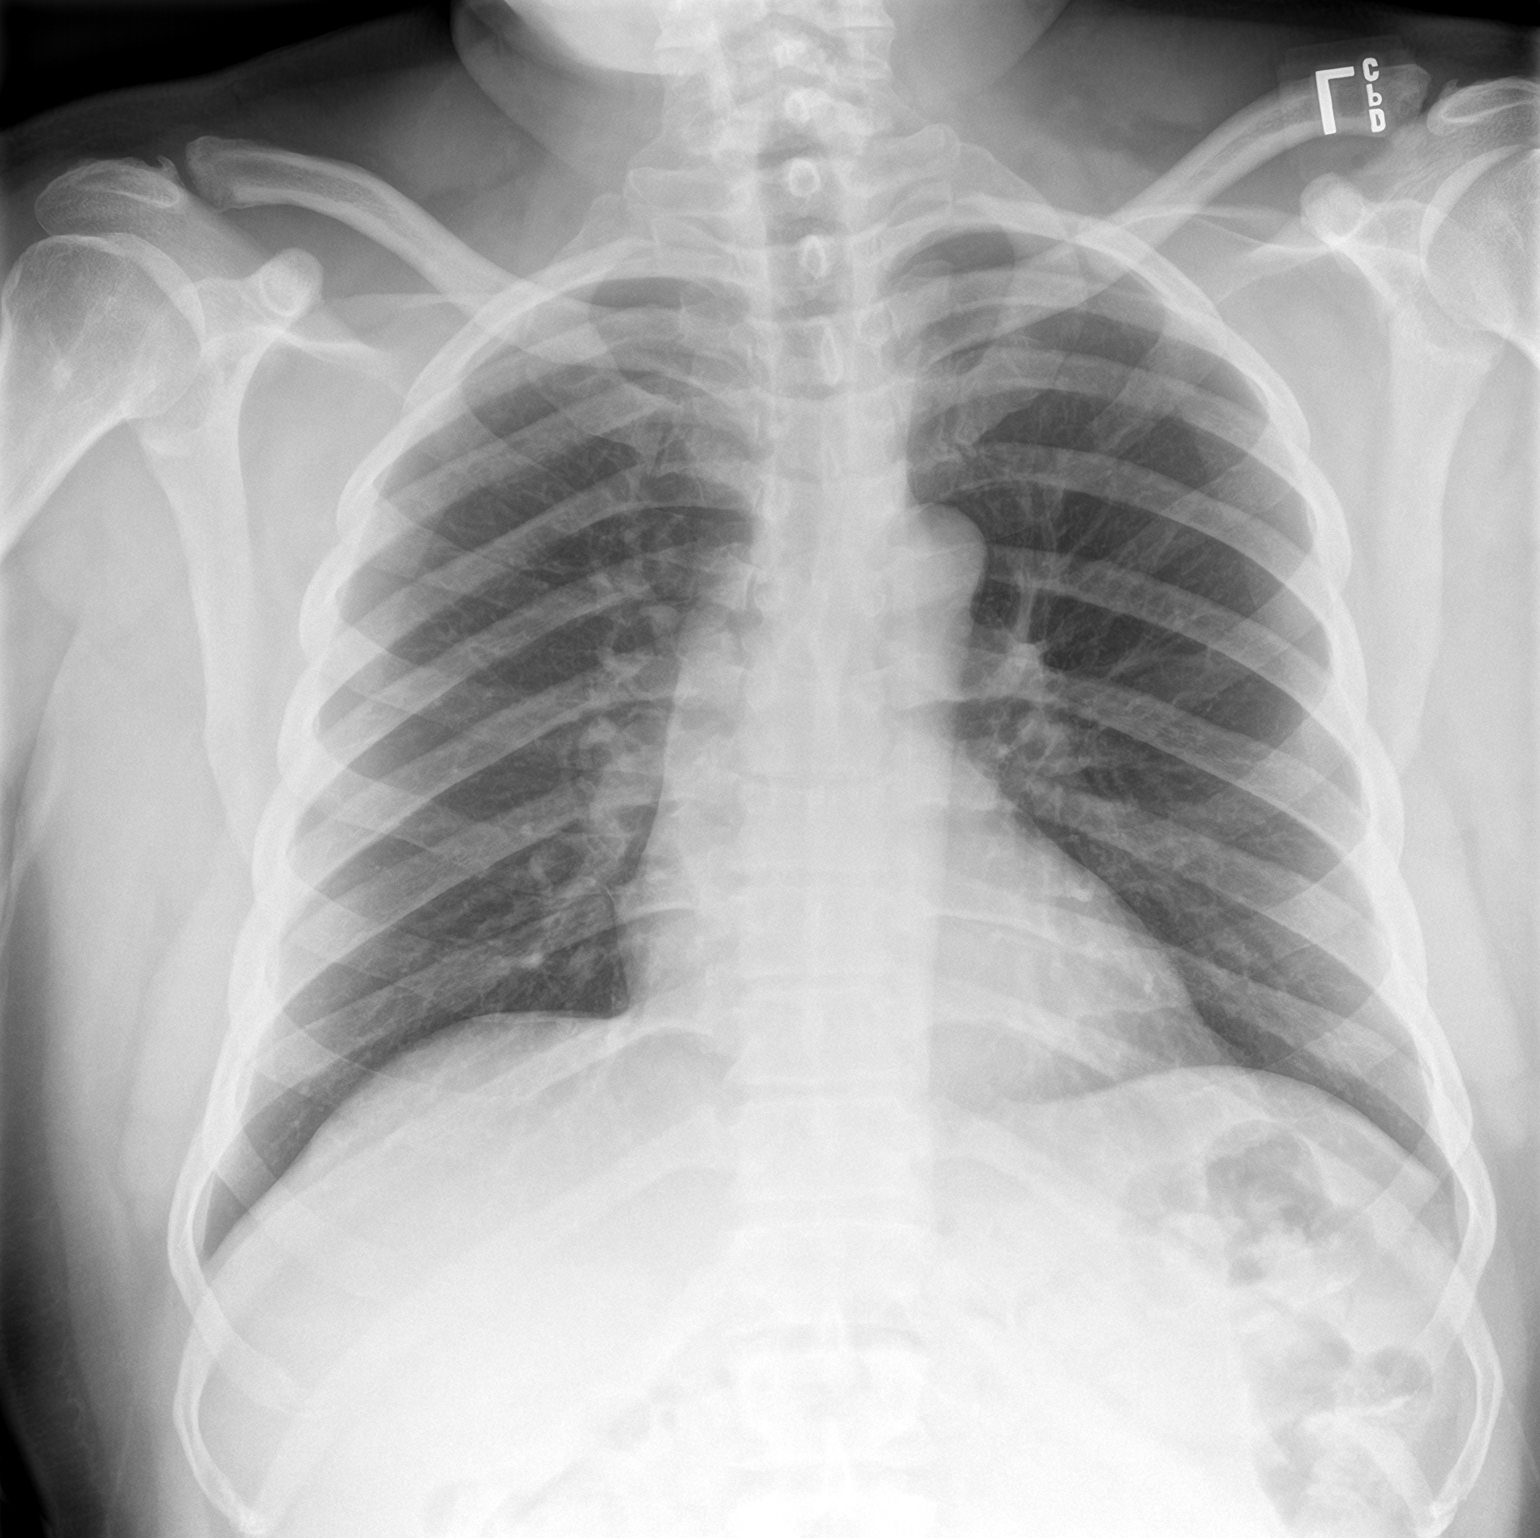

[chest lat]
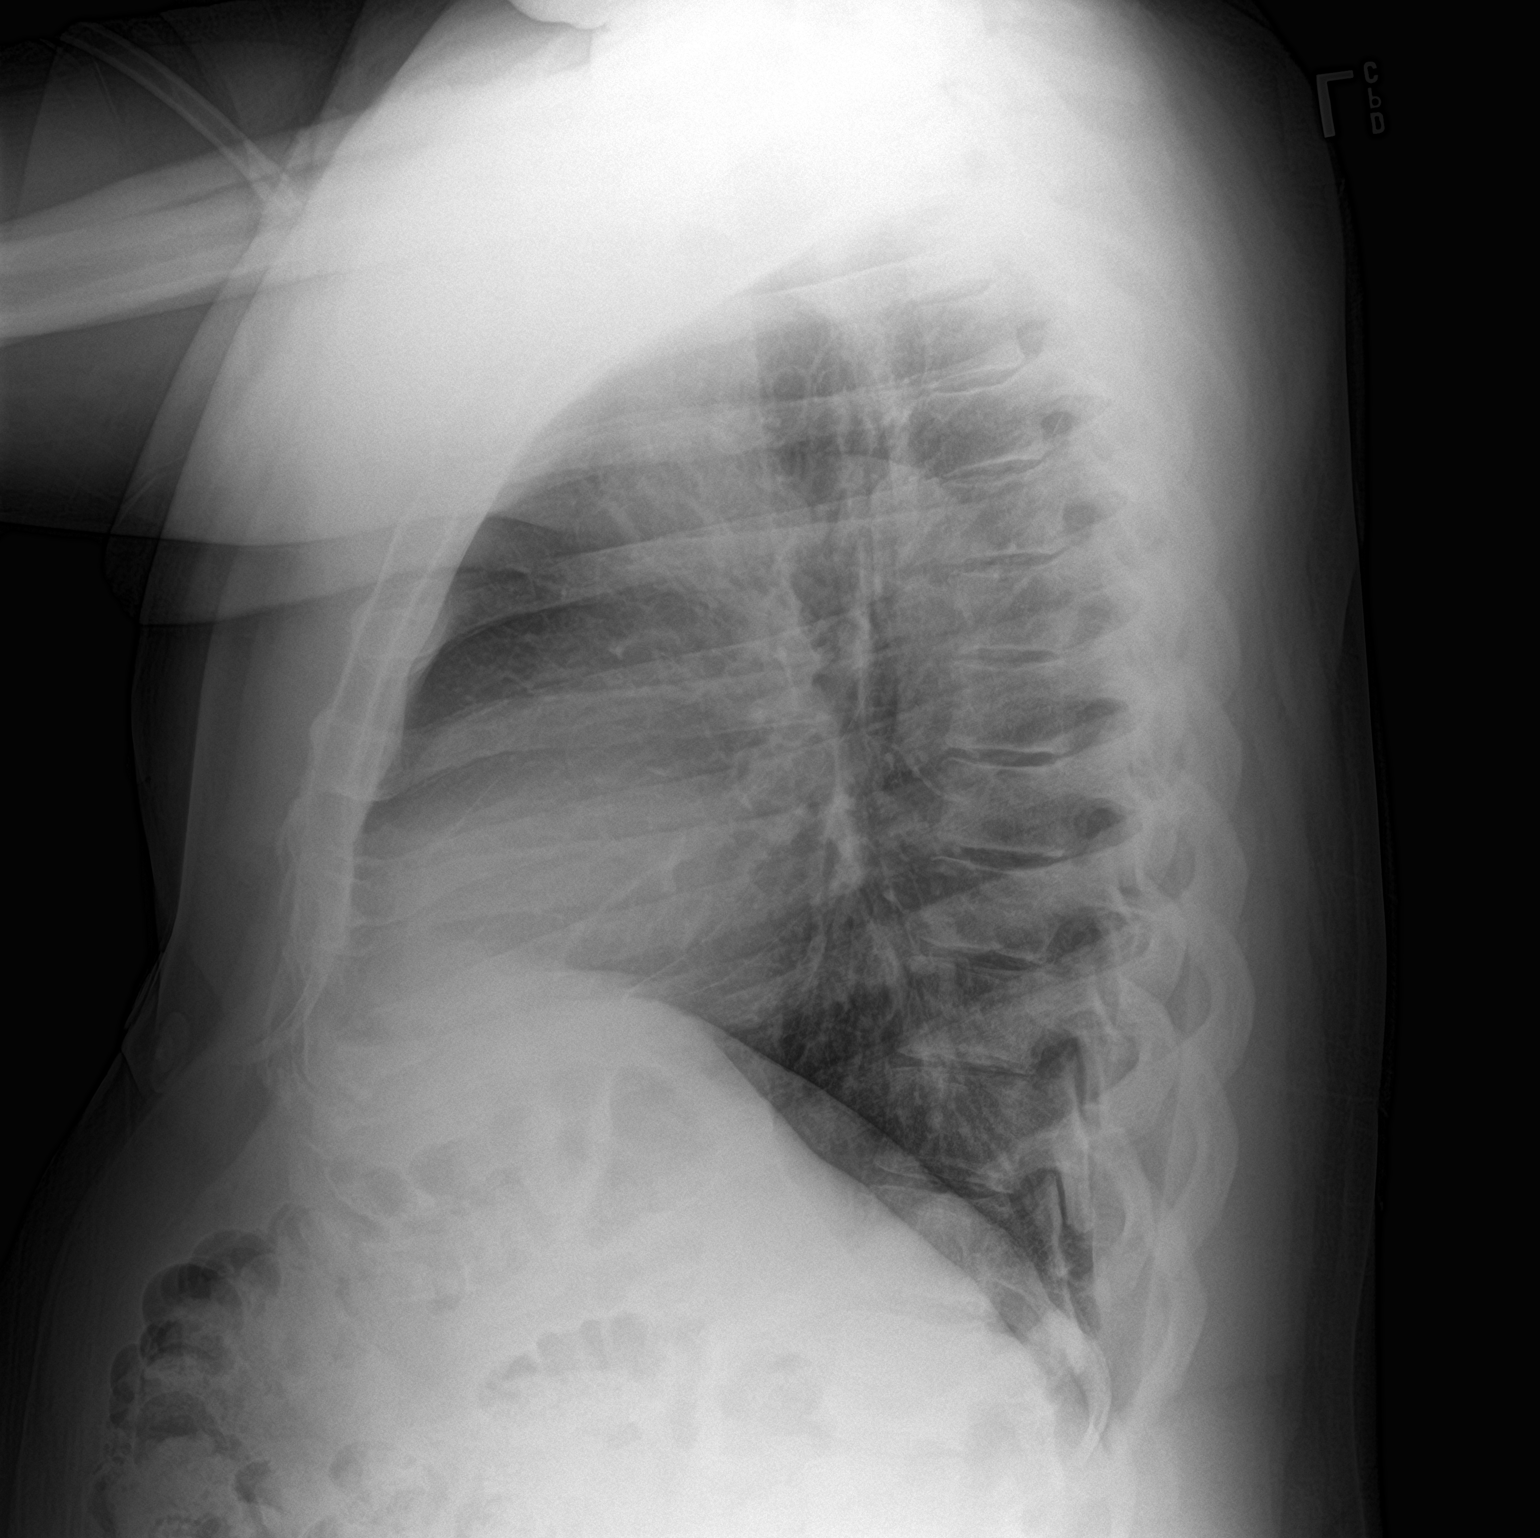

[2 of 2 positions shown; findings below may reference images not displayed]

FINDINGS: No consolidation, features of edema, pneumothorax, or effusion.
Pulmonary vascularity is normally distributed. The cardiomediastinal
contours are unremarkable. No acute osseous or soft tissue
abnormality.
IMPRESSION: No acute cardiopulmonary abnormality.

## 2023-01-02 ENCOUNTER — Ambulatory Visit: Payer: BC Managed Care – PPO | Admitting: Orthopedic Surgery

## 2023-01-02 ENCOUNTER — Encounter: Payer: Self-pay | Admitting: Orthopedic Surgery

## 2023-01-02 VITALS — BP 148/86 | HR 73 | Ht 71.0 in | Wt 252.0 lb

## 2023-01-02 DIAGNOSIS — M25561 Pain in right knee: Secondary | ICD-10-CM

## 2023-01-02 NOTE — Progress Notes (Signed)
Chief Complaint  Patient presents with   Knee Pain    Fu rt knee  injection no pain doing well    Encounter Diagnosis  Name Primary?   Right knee pain, unspecified chronicity Yes    62 year old male history of injection right knee not having any symptoms right now  Exam of his right knee trace effusion but full range of motion no tenderness absolutely rocksolid stability  No x-rays today x-rays canceled asymptomatic
